# Patient Record
Sex: Male | Born: 1980 | Race: White | Hispanic: No | Marital: Single | Smoking: Former smoker
Health system: Southern US, Community
[De-identification: ages and names within clinical notes are randomized; demographics above are authoritative.]

## PROBLEM LIST (undated history)

## (undated) HISTORY — PX: FRACTURE SURGERY: SHX138

## (undated) HISTORY — PX: TONSILLECTOMY: SUR1361

---
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
## 2008-12-15 ENCOUNTER — Ambulatory Visit: Payer: Self-pay | Admitting: Psychiatry

## 2008-12-15 ENCOUNTER — Inpatient Hospital Stay (HOSPITAL_COMMUNITY): Admission: RE | Admit: 2008-12-15 | Discharge: 2008-12-19 | Payer: Self-pay | Admitting: Psychiatry

## 2010-09-23 LAB — HEPATIC FUNCTION PANEL
ALT: 121 U/L — ABNORMAL HIGH (ref 0–53)
ALT: 146 U/L — ABNORMAL HIGH (ref 0–53)
AST: 103 U/L — ABNORMAL HIGH (ref 0–37)
Alkaline Phosphatase: 62 U/L (ref 39–117)
Alkaline Phosphatase: 68 U/L (ref 39–117)
Bilirubin, Direct: 0.1 mg/dL (ref 0.0–0.3)
Bilirubin, Direct: 0.1 mg/dL (ref 0.0–0.3)
Indirect Bilirubin: 0.4 mg/dL (ref 0.3–0.9)
Total Bilirubin: 0.5 mg/dL (ref 0.3–1.2)
Total Protein: 5.5 g/dL — ABNORMAL LOW (ref 6.0–8.3)

## 2010-09-23 LAB — BENZODIAZEPINE, QUANTITATIVE, URINE
Alprazolam (GC/LC/MS), ur confirm: 160 ng/mL
Flurazepam GC/MS Conf: NEGATIVE
Nordiazepam GC/MS Conf: 690 ng/mL
Oxazepam GC/MS Conf: 3400 ng/mL

## 2010-09-23 LAB — DRUGS OF ABUSE SCREEN W/O ALC, ROUTINE URINE
Amphetamine Screen, Ur: POSITIVE — AB
Barbiturate Quant, Ur: NEGATIVE
Benzodiazepines.: POSITIVE — AB
Cocaine Metabolites: NEGATIVE
Creatinine,U: 181.9 mg/dL
Marijuana Metabolite: POSITIVE — AB
Methadone: NEGATIVE
Opiate Screen, Urine: NEGATIVE
Phencyclidine (PCP): NEGATIVE
Propoxyphene: NEGATIVE

## 2010-09-23 LAB — CBC
HCT: 44.3 % (ref 39.0–52.0)
Hemoglobin: 15.6 g/dL (ref 13.0–17.0)
MCHC: 35.3 g/dL (ref 30.0–36.0)
MCV: 86.8 fL (ref 78.0–100.0)
RBC: 5.11 MIL/uL (ref 4.22–5.81)
WBC: 6.6 10*3/uL (ref 4.0–10.5)

## 2010-09-23 LAB — HEPATITIS PANEL, ACUTE
Hep A IgM: NEGATIVE
Hep B C IgM: NEGATIVE

## 2010-09-23 LAB — URINALYSIS, ROUTINE W REFLEX MICROSCOPIC
Bilirubin Urine: NEGATIVE
Glucose, UA: NEGATIVE mg/dL
Hgb urine dipstick: NEGATIVE
Specific Gravity, Urine: 1.017 (ref 1.005–1.030)
Urobilinogen, UA: 1 mg/dL (ref 0.0–1.0)

## 2010-09-23 LAB — AMPHETAMINES URINE CONFIRMATION
Amphetamines: 3100 ng/mL
Methamphetamine GC/MS, Ur: NEGATIVE
Methylenedioxyamphetamine: NEGATIVE
Methylenedioxyethylamphetamine: NEGATIVE
Methylenedioxymethamphetamine: NEGATIVE

## 2010-09-23 LAB — BASIC METABOLIC PANEL
BUN: 13 mg/dL (ref 6–23)
CO2: 31 mEq/L (ref 19–32)
Chloride: 101 mEq/L (ref 96–112)
Creatinine, Ser: 1.2 mg/dL (ref 0.4–1.5)
Glucose, Bld: 100 mg/dL — ABNORMAL HIGH (ref 70–99)
Potassium: 3.9 mEq/L (ref 3.5–5.1)

## 2010-09-23 LAB — THC (MARIJUANA), URINE, CONFIRMATION: Marijuana, Ur-Confirmation: >500 ng/mL

## 2010-11-02 NOTE — Discharge Summary (Signed)
NAME:  Bobby Cain, Bobby Cain           ACCOUNT NO.:  [id number]   MEDICAL RECORD NO.:  [id number]          PATIENT TYPE:  IPS   LOCATION:  0507                          FACILITY:  [facility]   PHYSICIAN:  Geoffery Lyons, M.D.      DATE OF BIRTH:  July 11, 1980   DATE OF ADMISSION:  12/15/2008  DATE OF DISCHARGE:  12/19/2008                               DISCHARGE SUMMARY   CHIEF COMPLAINT/PRESENT ILLNESS:  This was the first admission to [facility] for this 30 year old male who was admitted  abusing Adderall, smoking marijuana, decreased sleep, using up to 100 mg  plus of Adderall using the prescription in 2 weeks.  Denies suicide  ideations.  Knows he needs help.   PAST PSYCHIATRIC HISTORY:  First time at [facility].  Has seen  Dr. Ander Cain for the last 2 years.  Has been on Paxil in the past.   ALCOHOL/DRUG HISTORY:  As already stated, abusing Adderall, smoking  marijuana.   MEDICAL HISTORY:  Noncontributory.   MEDICATIONS:  1. Prozac 20 mg per day.  2. Adderall 30 mg twice a day, gone in 2 weeks.   PHYSICAL EXAMINATION:  Failed to show any acute findings.   LABORATORY DATA:  TSH 2.522.  BMET within normal limits.  SGOT 180, SGPT  146.  CBC within normal limits.   MENTAL STATUS EXAM:  Reveals alert cooperative male somewhat tired.  Mood depressed.  Affect depressed.  Thought processes logical, coherent  and relevant.  Having a hard time focusing as he described feeling very  overwhelmed with everything that has been going on.  No active suicide  or homicide ideas.  No hallucinations.  No delusions.  Cognition well  preserved.   ADMISSION DIAGNOSES:  AXIS I:  Amphetamine abuse, marijuana abuse and  attention deficit hyperactivity disorder.  AXIS II:  No diagnosis.  AXIS III:  No diagnosis.  AXIS IV:  Moderate.  AXIS V:  Upon admission 35, global assessment of functioning in the last  year 70.   COURSE IN THE HOSPITAL:  He was admitted.  Started individual and  group  psychotherapy.  As already stated, abusing marijuana, decreased sleep,  was using Prozac, using for 14 years, Prozac for the last month.  Endorsed that he was dealing with social anxiety.  He used to drink  quite a bit, not anymore, was on Paxil.  January 13, 2009, he felt he was  so much better, ready to be discharged, but does not know that the  mother worries about him.  There was increasing SGOT and increasing  SGPT.  He was not sure what was going to happen when he got out of the  hospital.  Hepatitis profile was negative.  He endorsed use of multiple  supplements and muscle building.  We were trying to use Librium to help  him through detox, but he was very sedated.  Librium was discontinued.  There was a family session December 18, 2008 with the father and mother.  The  family was cognizant that he does suffer from ADHD and that he needs  medications.  Bobby Cain has  plans for himself.  Wanted to get a job and  return to school.  December 19, 2008, he was in full contact with reality.  There were no active suicide or homicide ideas.  No hallucinations or  delusions.  He was willing and motivated to pursue abstinence and pursue  further outpatient treatment.   DISCHARGE DIAGNOSES:  AXIS I:  Amphetamine abuse, marijuana abuse,  attention deficit hyperactivity disorder, social anxiety.  AXIS II:  No diagnosis.  AXIS III:  Increased liver enzymes.  AXIS IV:  Moderate.  AXIS V:  Upon discharge 50.   FOLLOW UP:  Follow up with Bobby Cain, M.D.   DISCHARGE MEDICATIONS:  Placed on Prozac 20 mg per day.      Geoffery Lyons, M.D.  Electronically Signed     IL/MEDQ  D:  01/18/2009  T:  01/18/2009  Job:  [id number]

## 2016-07-30 ENCOUNTER — Ambulatory Visit (INDEPENDENT_AMBULATORY_CARE_PROVIDER_SITE_OTHER): Payer: BLUE CROSS/BLUE SHIELD | Admitting: Neurology

## 2016-07-30 ENCOUNTER — Encounter: Payer: Self-pay | Admitting: Neurology

## 2016-07-30 VITALS — BP 126/72 | HR 95 | Wt 221.6 lb

## 2016-07-30 DIAGNOSIS — F0781 Postconcussional syndrome: Secondary | ICD-10-CM | POA: Diagnosis not present

## 2016-07-30 DIAGNOSIS — M542 Cervicalgia: Secondary | ICD-10-CM | POA: Diagnosis not present

## 2016-07-30 DIAGNOSIS — H8110 Benign paroxysmal vertigo, unspecified ear: Secondary | ICD-10-CM

## 2016-07-30 DIAGNOSIS — G44309 Post-traumatic headache, unspecified, not intractable: Secondary | ICD-10-CM

## 2016-07-30 DIAGNOSIS — F17210 Nicotine dependence, cigarettes, uncomplicated: Secondary | ICD-10-CM

## 2016-07-30 NOTE — Patient Instructions (Addendum)
I will send you to neuro-rehab to treat vertigo, neck pain and incoordination.    To help reduce HEADACHES: Coenzyme Q10 160mg  ONCE DAILY Riboflavin/Vitamin B2 400mg  ONCE DAILY Magnesium oxide 400mg  ONCE - TWICE DAILY May stop after headaches are resolved.                                                                                               To help with INSOMNIA: Melatonin 3-5mg  AT BEDTIME       Other medicines to help decrease inflammation Alpha Lipoic Acid 100mg  TWICE DAILY Turmeric 500mg  twice daily Iron 65mg  elemental daily Vitamin D 4000 IU daily for 2 weeks then 2000 IU daily thereafter.  Follow up in 2 months.
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
## 2016-08-02 ENCOUNTER — Encounter: Payer: Self-pay | Admitting: Physical Therapy

## 2016-08-02 ENCOUNTER — Ambulatory Visit: Payer: BLUE CROSS/BLUE SHIELD | Attending: Neurology | Admitting: Physical Therapy

## 2016-08-02 ENCOUNTER — Telehealth: Payer: Self-pay | Admitting: Physical Therapy

## 2016-08-02 DIAGNOSIS — R296 Repeated falls: Secondary | ICD-10-CM

## 2016-08-02 DIAGNOSIS — R4189 Other symptoms and signs involving cognitive functions and awareness: Secondary | ICD-10-CM

## 2016-08-02 DIAGNOSIS — R2681 Unsteadiness on feet: Secondary | ICD-10-CM | POA: Diagnosis present

## 2016-08-02 DIAGNOSIS — R42 Dizziness and giddiness: Secondary | ICD-10-CM | POA: Insufficient documentation

## 2016-08-02 NOTE — Telephone Encounter (Signed)
Tad Mooreasandra, Could we refer Mr. Estala to speech therapy for cognitive rehabilitation?  Thanks.

## 2016-08-02 NOTE — Telephone Encounter (Signed)
Done
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
## 2016-08-02 NOTE — Telephone Encounter (Signed)
Dr. Everlena CooperJaffe, Pt seen for PT evaluation today.  Pt noted to have significant cognitive impairments as part of post-concussive syndrome.  Pt would benefit from and is interested in Speech Therapy for cognitive rehabilitation.  Please write an order for Speech therapy eval/treat if you agree.  Thanks, Edman CircleAudra Hall, PT, DPT 08/02/16    2:35 PM

## 2016-08-08 ENCOUNTER — Ambulatory Visit: Payer: BLUE CROSS/BLUE SHIELD

## 2016-08-08 DIAGNOSIS — R42 Dizziness and giddiness: Secondary | ICD-10-CM

## 2016-08-08 DIAGNOSIS — R2681 Unsteadiness on feet: Secondary | ICD-10-CM

## 2016-08-08 NOTE — Patient Instructions (Signed)
Gaze Stabilization: Tip Card  1.Target must remain in focus, not blurry, and appear stationary while head is in motion. 2.Perform exercises with small head movements (45 to either side of midline). 3.Increase speed of head motion so long as target is in focus. 4.If you wear eyeglasses, be sure you can see target through lens (therapist will give specific instructions for bifocal / progressive lenses). 5.These exercises may provoke dizziness or nausea. Work through these symptoms. If too dizzy, slow head movement slightly. Rest between each exercise. 6.Exercises demand concentration; avoid distractions. 7.For safety, perform standing exercises close to a counter, wall, corner, or next to someone.  Copyright  VHI. All rights reserved.    Gaze Stabilization: Standing Feet Apart    Feet shoulder width apart, keeping eyes on target on wall _3-5___ feet away, tilt head down 15-30 and move head side to side for __20-30__ seconds.  Do __2-3__ sessions per day.  Copyright  VHI. All rights reserved.

## 2016-08-08 NOTE — Therapy (Addendum)
[facility] [address] [place], [place], [address] Phone: [phone]   Fax:  [phone]  Physical Therapy Treatment  Patient Details  Name: Bobby Cain MRN: [id number] Date of Birth: February 05, 1981 Referring Provider: Drema Dallas, DO  Encounter Date: 08/08/2016      PT End of Session - 08/08/16 1218    Visit Number 2   Number of Visits 5   Date for PT Re-Evaluation 09/01/16   Authorization Type Blue Cross Blue [employer]   PT Start Time 1101   PT Stop Time 1146   PT Time Calculation (min) 45 min   Equipment Utilized During Treatment --  min guard prn and SOT harness   Activity Tolerance Patient tolerated treatment well   Behavior During Therapy [facility] for tasks assessed/performed      History reviewed. No pertinent past medical history.  History reviewed. No pertinent surgical history.  There were no vitals filed for this visit.      Subjective Assessment - 08/08/16 1108    Subjective Pt reports he has been experiencing allergies for the last few days, rash on arms/back and clear nasal mucus. Pt was house sitting dogs in a home with mildew. Pt reports dizziness is about the same (worse when looking up). Pt feels "sluggish" and fatigued. Pt feels frustrated that he's not back to normal yet.  Pt reports neck pain is getting better.    Pertinent History none   Currently in Pain? No/denies            [facility] PT Assessment - 08/08/16 1112      Functional Gait  Assessment   Gait assessed  Yes   Gait Level Surface Walks 20 ft in less than 7 sec but greater than 5.5 sec, uses assistive device, slower speed, mild gait deviations, or deviates 6-10 in outside of the 12 in walkway width.  2.5 seconds   Change in Gait Speed Able to smoothly change walking speed without loss of balance or gait deviation. Deviate no more than 6 in outside of the 12 in walkway width.   Gait with Horizontal Head Turns Performs head  turns smoothly with slight change in gait velocity (eg, minor disruption to smooth gait path), deviates 6-10 in outside 12 in walkway width, or uses an assistive device.   Gait with Vertical Head Turns Performs task with slight change in gait velocity (eg, minor disruption to smooth gait path), deviates 6 - 10 in outside 12 in walkway width or uses assistive device   Gait and Pivot Turn Turns slowly, requires verbal cueing, or requires several small steps to catch balance following turn and stop   Step Over Obstacle Is able to step over 2 stacked shoe boxes taped together (9 in total height) without changing gait speed. No evidence of imbalance.   Gait with Narrow Base of Support Is able to ambulate for 10 steps heel to toe with no staggering.   Gait with Eyes Closed Walks 20 ft, no assistive devices, good speed, no evidence of imbalance, normal gait pattern, deviates no more than 6 in outside 12 in walkway width. Ambulates 20 ft in less than 7 sec.   Ambulating Backwards Walks 20 ft, uses assistive device, slower speed, mild gait deviations, deviates 6-10 in outside 12 in walkway width.   Steps Alternating feet, no rail.   Total Score 24       Neuro re-ed: Neuro re-ed: sensory organization test performed with following results: Conditions: 1:  WNL 2: 1 trial WNL and 2 trials below normal 3: 1 trial WNL and 2 trials below normal  4: 1 trial WNL and 2 trials below normal  5: All 3 trials "falls" 6: 1 trial WNL and 2 trials below normal Composite score: 53, below normal for his age group Sensory Analysis Som: Below normal (~90) Vis: Below normal (~70) Vest: Below normal (~1) Pref: WNL, but this is likely incorrect as the vestibular value of 1 skews the ratio. Strategy analysis: WNL, except for decreased hip strategy during conditions 5 and 6.       COG alignment: WNL, with occasional posterior bias.                      Vestibular Treatment/Exercise - 08/08/16 0001       Vestibular Treatment/Exercise   Vestibular Treatment Provided Gaze   Habituation Exercises --   Gaze Exercises X1 Viewing Horizontal     X1 Viewing Horizontal   Foot Position Standing and seated. Cues and demo for technique. Please see pt instructions for details.    Time --  20-30 seconds.   Reps 4   Comments See above.               PT Education - 08/08/16 1218    Education provided Yes   Education Details PT discussed FGA and SOT results. PT also provided pt with gaze stabilization HEP.   Person(s) Educated Patient   Methods Explanation;Demonstration;Verbal cues;Handout   Comprehension Returned demonstration;Verbalized understanding             PT Long Term Goals - 08/08/16 1244      PT LONG TERM GOAL #1   Title  (TARGET DATE FOR ALL LTG 09/01/16) Pt will demonstrate independence with vestibular/balance HEP   Time 4   Period Weeks   Status New     PT LONG TERM GOAL #2   Title Pt will improve DVA to 2 line difference with improved gaze stability   Baseline 4 line difference   Time 4   Period Weeks   Status New     PT LONG TERM GOAL #3   Title Pt will perform FGA assessment with goal to be set at next visit   Status Achieved     PT LONG TERM GOAL #4   Title Pt will participate in SOT testing with LTG to be set   Status Achieved     PT LONG TERM GOAL #5   Title 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   Baseline Unable to work at this time   Time 4   Period Weeks   Status New     Additional Long Term Goals   Additional Long Term Goals Yes     PT LONG TERM GOAL #6   Title Pt will improve FGA score to >/=30/30 to decr. falls risk.    Status New     PT LONG TERM GOAL #7   Title Pt will improve SOT composite score to WNL for his age group (6430-36 years of age) to >/=70 in order to safety during functional mobility.    Status New               Plan - 08/08/16 1241     Clinical Impression Statement Pt's FGA score indicates pt is at a moderate risk for falls. Pt's composite SOT score is below normal for his age group (1430-139 years of age).  The SOT sensory analysis indicates decr. somatosensory, visual, and vestibular systems is decreased-especially the visual and vestibular systems. Pt reported incr. in dizziness (1-2/10) during SOT and during amb. with head turns. Continue with POC.    Rehab Potential Excellent   PT Frequency 1x / week   PT Duration 4 weeks   PT Treatment/Interventions ADLs/Self Care Home Management;Canalith Repostioning;Gait training;Functional mobility training;Therapeutic activities;Therapeutic exercise;Balance training;Neuromuscular re-education;Cognitive remediation;Patient/family education;Energy conservation;Vestibular;Visual/perceptual remediation/compensation   PT Next Visit Plan Review and modifiy gaze stabilization HEP as indicated. Dynamic gait and balance activities over compliant surfaces to improve vestibular input.    Consulted and Agree with Plan of Care Patient      Patient will benefit from skilled therapeutic intervention in order to improve the following deficits and impairments:  Decreased balance, Dizziness, Impaired sensation, Impaired vision/preception, Pain  Visit Diagnosis: Dizziness and giddiness  Unsteadiness on feet     Problem List There are no active problems to display for this patient.   Dennise Raabe L 08/08/2016, 1:08 PM  [facility] [address] [place], [place], [address] Phone: [phone]   Fax:  [phone]  Name: LENDELL GALLICK MRN: [id number] Date of Birth: 1981-05-16  Zerita Boers, PT,DPT 08/08/16 1:08 PM Phone: [phone] Fax: [phone]

## 2016-08-15 ENCOUNTER — Ambulatory Visit: Payer: BLUE CROSS/BLUE SHIELD | Attending: Neurology | Admitting: Physical Therapy

## 2016-08-15 ENCOUNTER — Encounter: Payer: Self-pay | Admitting: Physical Therapy

## 2016-08-15 ENCOUNTER — Ambulatory Visit: Payer: BLUE CROSS/BLUE SHIELD | Admitting: Speech Pathology

## 2016-08-15 DIAGNOSIS — R41841 Cognitive communication deficit: Secondary | ICD-10-CM | POA: Insufficient documentation

## 2016-08-15 DIAGNOSIS — R2681 Unsteadiness on feet: Secondary | ICD-10-CM | POA: Diagnosis present

## 2016-08-15 DIAGNOSIS — R42 Dizziness and giddiness: Secondary | ICD-10-CM

## 2016-08-15 DIAGNOSIS — R296 Repeated falls: Secondary | ICD-10-CM | POA: Insufficient documentation

## 2016-08-15 NOTE — Patient Instructions (Signed)
  Cognitive Activities you can do at home:   - Solitaire  - Majong  - Scrabble  - Chess/Checkers  - Crosswords (easy level)  - Juanna CaoUno  - Card Games  - Board Games  - Connect 4  - Simon  - the Memory Game  - Dominoes  - Backgammon  On your computer, tablet or phone: BrainHQ [email or web address] Secretary/administratoreuronation App Memory Match Game App Rush Hour Chocolate Fix Sort it out
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
## 2016-08-15 NOTE — Patient Instructions (Signed)
-   Eyes Open    With eyes open:  feet together, move head slowly: up and down x10 to each side. Then up and down x 10 each way.  Repeat 2  times per session. Do 2 sessions per day.  Copyright  VHI. All rights reserved.      Feet Partial Heel-Toe (Compliant Surface) Head Motion - Eyes Open    With eyes open, standing on compliant surface: right foot partially in front of the other (or completely in front of left foot), move head slowly: right and left x 10, up and down x 10 Repeat 2 times per session. Do 2 sessions per day.  Copyright  VHI. All rights reserved.      Gaze Stabilization: Tip Card  1.Target must remain in focus, not blurry, and appear stationary while head is in motion. 2.Perform exercises with small head movements (45 to either side of midline). 3.Increase speed of head motion so long as target is in focus. 4.If you wear eyeglasses, be sure you can see target through lens (therapist will give specific instructions for bifocal / progressive lenses). 5.These exercises may provoke dizziness or nausea. Work through these symptoms. If too dizzy, slow head movement slightly. Rest between each exercise. 6.Exercises demand concentration; avoid distractions.  Copyright  VHI. All rights reserved.     Special Instructions: Exercises may bring on mild to moderate symptoms of blurry vision, double vision, nausea that resolve within 30 minutes of completing exercises. If symptoms are lasting longer than 30 minutes, modify your exercises by:  >decreasing the # of times you complete each activity >ensuring your symptoms return to baseline before moving onto the next exercise >dividing up exercises so you do not do them all in one session, but multiple short sessions throughout the day >doing them once a day until symptoms improve   Gaze Stabilization: Standing Feet Together    Feet together, keeping eyes on target on wall _2-3___ feet away, tilt head down 15-30 and  move head side to side for _30___ seconds. Repeat while moving head up and down for __30__ seconds. Repeat 3 times each direction. Do __3__ sessions per day. Repeat using target on pattern background.  Copyright  VHI. All rights reserved.  Gaze Stabilization: Standing Feet Heel-Toe "Tandem"    Feet in full heel-toe position, keeping eyes on target on wall __2-3__ feet away, tilt head down 15-30 and move head side to side for _30___ seconds. Repeat while moving head up and down for _30___ seconds.  Repeat 3 times each direction Do __3__ sessions per day. Repeat using target on pattern background.  Copyright  VHI. All rights reserved.
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
## 2016-08-22 ENCOUNTER — Ambulatory Visit: Payer: BLUE CROSS/BLUE SHIELD | Admitting: Physical Therapy

## 2016-08-22 ENCOUNTER — Encounter: Payer: Self-pay | Admitting: Physical Therapy

## 2016-08-22 DIAGNOSIS — R296 Repeated falls: Secondary | ICD-10-CM

## 2016-08-22 DIAGNOSIS — R42 Dizziness and giddiness: Secondary | ICD-10-CM | POA: Diagnosis not present

## 2016-08-22 DIAGNOSIS — R2681 Unsteadiness on feet: Secondary | ICD-10-CM

## 2016-08-22 NOTE — Therapy (Signed)
[facility] [address] [place], [place], [address] Phone: [phone]   Fax:  [phone]  Physical Therapy Treatment  Patient Details  Name: Bobby Cain MRN: [id number] Date of Birth: 1981/05/07 Referring Provider: Drema Dallas, DO  Encounter Date: 08/22/2016      PT End of Session - 08/22/16 1158    Visit Number 4   Number of Visits 5   Date for PT Re-Evaluation 09/01/16   Authorization Type Blue [employer] Blue [employer]   PT Start Time 1105   PT Stop Time 1153   PT Time Calculation (min) 48 min   Activity Tolerance Patient tolerated treatment well   Behavior During Therapy [facility] for tasks assessed/performed      History reviewed. No pertinent past medical history.  History reviewed. No pertinent surgical history.  There were no vitals filed for this visit.      Subjective Assessment - 08/22/16 1109    Subjective Pt reports exercises are going well at home; still on light duty at work.  No issues except some pressure in his head afterwards.  Still doing some jogging.  Reports feeling fatigued.   Limitations Other (comment)   Patient Stated Goals "get organized" pt mainly concerned about cognitive issues and personality changes   Currently in Pain? No/denies           [facility] Adult PT Treatment/Exercise - 08/22/16 1126      Neuro Re-ed    Neuro Re-ed Details  Performed gait forwards and retro >1,000 while performing reaching to ground for rolling ball > toss and then performing ball bounce<>toss up during gait forwards and retro with added cognitive tasks; pt with increased difficulty with naming tasks but able to perform math task without difficulty.     Exercises   Exercises Knee/Hip     Knee/Hip Exercises: Standing   Forward Step Up Right;Left;10 reps;Step Height: 8"   Forward Step Up Limitations no UE support, then holding 8lb weight in RUE and then LUE   Step Down Right;Left;10 reps;Step Height:  8"  step down retro, then holding weight in RUE and then LUE   Functional Squat 20 reps   Functional Squat Limitations Reaching down to floor to retrieve weighted items and then retrieve weighted items and transfer to storage reaching side to side           Balance Exercises - 08/22/16 1156      Balance Exercises: Standing   Balance Beam Standing on balance beam bilat LE >> SLS while reaching out of BOS to R and L for pegs and placing pegs on board in various patterns for more fine motor training           PT Education - 08/22/16 1157    Education provided Yes   Education Details Pt progress; plan to D/C at next session   Person(s) Educated Patient   Methods Explanation   Comprehension Verbalized understanding             PT Long Term Goals - 08/08/16 1244      PT LONG TERM GOAL #1   Title  (TARGET DATE FOR ALL LTG 09/01/16) Pt will demonstrate independence with vestibular/balance HEP   Time 4   Period Weeks   Status New     PT LONG TERM GOAL #2   Title Pt will improve DVA to 2 line difference with improved gaze stability   Baseline 4 line difference   Time 4  Period Weeks   Status New     PT LONG TERM GOAL #3   Title Pt will perform FGA assessment with goal to be set at next visit   Status Achieved     PT LONG TERM GOAL #4   Title Pt will participate in SOT testing with LTG to be set   Status Achieved     PT LONG TERM GOAL #5   Title 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   Baseline Unable to work at this time   Time 4   Period Weeks   Status New     Additional Long Term Goals   Additional Long Term Goals Yes     PT LONG TERM GOAL #6   Title Pt will improve FGA score to >/=30/30 to decr. falls risk.    Status New     PT LONG TERM GOAL #7   Title Pt will improve SOT composite score to WNL for his age group (1930-36 years of age) to >/=70 in order to safety during functional  mobility.    Status New               Plan - 08/22/16 1158    Clinical Impression Statement Pt making great progress towards goals with pt able to tolerate higher level gait, balance, gross motor and fine motor and dual task activities with no c/o dizziness.  Pt continues to have greatest difficulty with cognitive activities and feels he would like to focus more on Speech therapy and cognition.   Rehab Potential Excellent   PT Frequency 1x / week   PT Duration 4 weeks   PT Treatment/Interventions ADLs/Self Care Home Management;Canalith Repostioning;Gait training;Functional mobility training;Therapeutic activities;Therapeutic exercise;Balance training;Neuromuscular re-education;Cognitive remediation;Patient/family education;Energy conservation;Vestibular;Visual/perceptual remediation/compensation   PT Next Visit Plan Next session review LTG and plan to D/C and allow pt to focus on SLP/cognition   Consulted and Agree with Plan of Care Patient      Patient will benefit from skilled therapeutic intervention in order to improve the following deficits and impairments:  Decreased balance, Dizziness, Impaired sensation, Impaired vision/preception, Pain  Visit Diagnosis: Dizziness and giddiness  Unsteadiness on feet  Repeated falls     Problem List There are no active problems to display for this patient.  Edman CircleAudra Hall, PT, DPT 08/22/16    12:04 PM    [facility] [address] [place], [place], [address] Phone: [phone]   Fax:  [phone]  Name: Bobby Cain MRN: [id number] Date of Birth: 1981-05-26

## 2016-08-29 ENCOUNTER — Encounter: Payer: Self-pay | Admitting: Physical Therapy

## 2016-08-29 ENCOUNTER — Ambulatory Visit: Payer: BLUE CROSS/BLUE SHIELD | Admitting: Physical Therapy

## 2016-08-29 DIAGNOSIS — R2681 Unsteadiness on feet: Secondary | ICD-10-CM

## 2016-08-29 DIAGNOSIS — R296 Repeated falls: Secondary | ICD-10-CM

## 2016-08-29 DIAGNOSIS — R42 Dizziness and giddiness: Secondary | ICD-10-CM | POA: Diagnosis not present

## 2016-08-29 NOTE — Patient Instructions (Signed)
Gaze Stabilization: Tip Card  1.Target must remain in focus, not blurry, and appear stationary while head is in motion. 2.Perform exercises with small head movements (45 to either side of midline). 3.Increase speed of head motion so long as target is in focus. 4.If you wear eyeglasses, be sure you can see target through lens (therapist will give specific instructions for bifocal / progressive lenses). 5.These exercises may provoke dizziness or nausea. Work through these symptoms. If too dizzy, slow head movement slightly. Rest between each exercise. 6.Exercises demand concentration; avoid distractions. 7.For safety, perform standing exercises close to a counter, wall, corner, or next to someone.  Copyright  VHI. All rights reserved.    Gaze Stabilization: Standing Feet Apart    Feet shoulder width apart, keeping eyes on target on wall _3-6___ feet away, tilt head down 15-30 and move head side to side for __30-60__ seconds. Repeat going up and down x 30-60 seconds. Do __2-3__ sessions per day.

## 2016-08-29 NOTE — Therapy (Signed)
[facility] [address] [place], [place], [address] Phone: [phone]   Fax:  [phone]  Physical Therapy Treatment  Patient Details  Name: Bobby Cain MRN: [id number] Date of Birth: 06/13/1981 Referring Provider: Pieter Partridge, DO  Encounter Date: 08/29/2016      PT End of Session - 08/29/16 1657    Visit Number 5   Number of Visits 5   Date for PT Re-Evaluation 09/01/16  D/C today   Authorization Type Blue Cross Blue [employer]   PT Start Time 1400   PT Stop Time 1450   PT Time Calculation (min) 50 min   Activity Tolerance Patient tolerated treatment well   Behavior During Therapy [facility] for tasks assessed/performed      History reviewed. No pertinent past medical history.  History reviewed. No pertinent surgical history.  There were no vitals filed for this visit.      Subjective Assessment - 08/29/16 1404    Subjective Pt reports work is still going well; still doing well physically.  No dizziness.   Pertinent History none   Limitations Other (comment)   Patient Stated Goals "get organized" pt mainly concerned about cognitive issues and personality changes   Currently in Pain? No/denies            [facility] PT Assessment - 08/29/16 1409      Functional Gait  Assessment   Gait assessed  Yes   Gait Level Surface Walks 20 ft in less than 5.5 sec, no assistive devices, good speed, no evidence for imbalance, normal gait pattern, deviates no more than 6 in outside of the 12 in walkway width.   Change in Gait Speed Able to smoothly change walking speed without loss of balance or gait deviation. Deviate no more than 6 in outside of the 12 in walkway width.   Gait with Horizontal Head Turns Performs head turns smoothly with no change in gait. Deviates no more than 6 in outside 12 in walkway width   Gait with Vertical Head Turns Performs head turns with no change in gait. Deviates no more than 6 in outside  12 in walkway width.   Gait and Pivot Turn Pivot turns safely within 3 sec and stops quickly with no loss of balance.   Step Over Obstacle Is able to step over 2 stacked shoe boxes taped together (9 in total height) without changing gait speed. No evidence of imbalance.   Gait with Narrow Base of Support Is able to ambulate for 10 steps heel to toe with no staggering.   Gait with Eyes Closed Walks 20 ft, no assistive devices, good speed, no evidence of imbalance, normal gait pattern, deviates no more than 6 in outside 12 in walkway width. Ambulates 20 ft in less than 7 sec.   Ambulating Backwards Walks 20 ft, no assistive devices, good speed, no evidence for imbalance, normal gait   Steps Alternating feet, no rail.   Total Score 30          Vestibular Assessment - 08/29/16 1405      Visual Acuity   Static 8   Dynamic 5          [facility] Adult PT Treatment/Exercise - 08/29/16 1434      Neuro Re-ed    Neuro Re-ed Details  SOT testing: 62 composite score      Sensory Organization Testing=62% compared to age/height normative value of 74%.   Patient demonstrated 85% use of somatosensory feedback for  balance, 74% use of visual feedback for balance, and 20% use of vestibular feedback for balance.        PT Education - 08/29/16 1656    Education provided Yes   Education Details Continue to focus on x 1 viewing and gaze stabilization exercises at home   Person(s) Educated Patient   Methods Explanation;Demonstration;Handout   Comprehension Verbalized understanding;Returned demonstration            PT Long Term Goals - 08/29/16 1435      PT LONG TERM GOAL #1   Title  (TARGET DATE FOR ALL LTG 09/01/16) Pt will demonstrate independence with vestibular/balance HEP   Status Achieved     PT LONG TERM GOAL #2   Title Pt will improve DVA to 2 line difference with improved gaze stability   Baseline 3 line difference on 3/15   Status Partially Met     PT LONG TERM GOAL #3   Title  Pt will perform FGA assessment with goal to be set at next visit   Status Achieved     PT LONG TERM GOAL #4   Title Pt will participate in SOT testing with LTG to be set   Status Achieved     PT LONG TERM GOAL #5   Title 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   Status Achieved     PT LONG TERM GOAL #6   Title Pt will improve FGA score to >/=30/30 to decr. falls risk.    Baseline 30/30 on 08/29/16   Status Achieved     PT LONG TERM GOAL #7   Title Pt will improve SOT composite score to WNL for his age group (74-36 years of age) to >/=70 in order to safety during functional mobility.    Baseline 62 composite score on 08/29/16   Status Partially Met               Plan - 08/29/16 1657    Clinical Impression Statement Pt has made good progress and has met 5/7 LTG; pt partially met DVA goal and was able to improve gaze stability by 1 line; now 3 line difference.  Reviewed x 1 viewing exercises and progressed exercises with pt and re-printed instructions.  Advised pt to continue exercises at home.  Pt did not meet SOT composite score goal but did improve to 62 with improved use of all systems but visual and vestibular still below average for age.  Pt is safe for D/C from PT and will continue with Speech therapy for cognitive rehabilitation.     PT Treatment/Interventions ADLs/Self Care Home Management;Canalith Repostioning;Gait training;Functional mobility training;Therapeutic activities;Therapeutic exercise;Balance training;Neuromuscular re-education;Cognitive remediation;Patient/family education;Energy conservation;Vestibular;Visual/perceptual remediation/compensation   PT Next Visit Plan D/C today   Consulted and Agree with Plan of Care Patient      Patient will benefit from skilled therapeutic intervention in order to improve the following deficits and impairments:  Decreased balance, Dizziness, Impaired  sensation, Impaired vision/preception, Pain  Visit Diagnosis: Dizziness and giddiness  Unsteadiness on feet  Repeated falls     Problem List There are no active problems to display for this patient.  PHYSICAL THERAPY DISCHARGE SUMMARY  Visits from Start of Care: 5  Current functional level related to goals / functional outcomes: See above   Remaining deficits: Gaze stability and use of visual and vestibular systems for balance recovery   Education / Equipment: HEP  Plan: Patient agrees to discharge.  Patient goals were partially  met. Patient is being discharged due to being pleased with the current functional level.  ?????     Raylene Everts, PT, DPT 08/29/16    5:05 PM    [facility] [address] [place], [place], [address] Phone: [phone]   Fax:  [phone]  Name: Bobby Cain MRN: [id number] Date of Birth: [birth year]

## 2016-09-03 ENCOUNTER — Ambulatory Visit: Payer: BLUE CROSS/BLUE SHIELD | Admitting: *Deleted

## 2016-09-03 DIAGNOSIS — R42 Dizziness and giddiness: Secondary | ICD-10-CM | POA: Diagnosis not present

## 2016-09-03 DIAGNOSIS — R41841 Cognitive communication deficit: Secondary | ICD-10-CM

## 2016-09-03 NOTE — Therapy (Signed)
[facility] [address] [place], [place], [address] Phone: [phone]   Fax:  [phone]  Speech Language Pathology Treatment  Patient Details  Name: Bobby Cain MRN: [id number] Date of Birth: [birth year] Referring Provider: Dr. Shon MilletAdam Jaffe  Encounter Date: 09/03/2016      End of Session - 09/03/16 1309    Visit Number 2   Number of Visits 17   Date for SLP Re-Evaluation 10/10/16   SLP Start Time 1100   SLP Stop Time  1145   SLP Time Calculation (min) 45 min   Activity Tolerance Patient tolerated treatment well      No past medical history on file.  No past surgical history on file.  There were no vitals filed for this visit.      Subjective Assessment - 09/03/16 1305    Subjective I got a speeding ticket - my decision making isn't so good   Currently in Pain? No/denies               ADULT SLP TREATMENT - 09/03/16 0001      General Information   Behavior/Cognition Alert;Cooperative;Pleasant mood     Treatment Provided   Treatment provided Cognitive-Linquistic     Pain Assessment   Pain Assessment No/denies pain     Cognitive-Linquistic Treatment   Treatment focused on Cognition;Patient/family/caregiver education   Skilled Treatment Skilled session targeted review of goals set following initial evaluation (last session). Pt reports having difficulty with attention and keeping up with things. SLP and pt identified 4 strategies for improving attention: do one thing at a time, take care of self (eat, drink, exercise, rest), minimize distractions, write it down) Pt and SLP also identified suggestions for improving functional recall - get a calendar, use notes, write down lists, to do's, identify a consistent place to keep things frequently lost - keys, wallet, meds.      Assessment / Recommendations / Plan   Plan Continue with current plan of care     Progression Toward Goals   Progression toward goals Progressing toward goals          SLP Education - 09/03/16 1309    Education provided Yes   Education Details improving attention and functional recall   Person(s) Educated Patient   Methods Explanation;Demonstration;Handout   Comprehension Verbalized understanding;Returned demonstration          SLP Short Term Goals - 09/03/16 1313      SLP SHORT TERM GOAL #1   Title Pt will utilize external/environmental aids to keep tract of keys, wallet, cards, remote with no more othan 3 items llost over 2 weeks   Time 4   Period Weeks   Status On-going     SLP SHORT TERM GOAL #2   Title Pt will verbalize 4 compensations for attention impairments with rare min A   Time 4   Period Weeks   Status On-going     SLP SHORT TERM GOAL #3   Title Pt will attend to details in moderately complex organization, reasoning cognitive linguistic tasks with 80% accuracy and occasional min A   Time 4   Period Weeks   Status On-going     SLP SHORT TERM GOAL #4   Title Pt will altenrate attention between 2 moderately complex cognitive linguistic tasks with 85% on each and occasional min A   Time 4   Period Weeks   Status On-going          SLP Long  Term Goals - 09/03/16 1313      SLP LONG TERM GOAL #1   Title Pt will perform cognitive linguistic organization tasks with 95% accuracy and rare min A   Time 8   Period Weeks   Status On-going     SLP LONG TERM GOAL #2   Title Pt will carryover strategies/external aids for organizing tasks, schedule, chores over 3 sessions with rare min A   Time 8   Period Weeks   Status On-going     SLP LONG TERM GOAL #3   Title Pt will divide attention between 2 simple cognitive linguistic tasks with 90% on each and rare min A   Time 8   Period Weeks   Status On-going          Plan - 09/03/16 1310    Clinical Impression Statement Pt presents with decreased attention and functional recall. Strategies were discussed and  provided in written form. Pt has few responsibilities - parents manage finances (insurance, cell phone, etc - even prior to [facility]), girlfriend pays rent (he will return to living with parents when she moves to [place]). Pt indicates difficulty with reflux at night. Provided information regarding management of esophageal dysmotility. Continued ST intervention is recommended to maximize safety and independence and prepare for return to work.    Speech Therapy Frequency 2x / week   Duration --  8 weeks/17 visits   Treatment/Interventions Compensatory strategies;Functional tasks;Patient/family education;Cognitive reorganization;Environmental controls;Internal/external aids;SLP instruction and feedback   Potential to Achieve Goals Good   Potential Considerations Ability to learn/carryover information   Consulted and Agree with Plan of Care Patient      Patient will benefit from skilled therapeutic intervention in order to improve the following deficits and impairments:   Cognitive communication deficit    Problem List There are no active problems to display for this patient.  Bryla Burek B. [facility], MSP, CCC-SLP  Leigh Aurora 09/03/2016, 1:14 PM  [facility] [address] [place], [place], [address] Phone: [phone]   Fax:  [phone]   Name: Bobby Cain MRN: [id number] Date of Birth: 05-21-1981

## 2016-09-03 NOTE — Patient Instructions (Signed)
Improving attention 1. Do 1 thing at a time 2. Take care of yourself - eat regularly, work out, stay hydrated 3. Minimize distractions - turn off radio, don't talk on the phone 4. Write it down    Suggestions for improving recall: 1. Get a small calendar 2. Use sticky notes or a pad in the back of the calendar 3. Write down things to do, schedules, lists - cross out when done 4. Find a consistent place to keep things you tend to misplace - keys, wallet, meds

## 2016-09-10 ENCOUNTER — Ambulatory Visit: Payer: BLUE CROSS/BLUE SHIELD | Admitting: Speech Pathology

## 2016-09-10 DIAGNOSIS — R41841 Cognitive communication deficit: Secondary | ICD-10-CM

## 2016-09-10 DIAGNOSIS — R42 Dizziness and giddiness: Secondary | ICD-10-CM | POA: Diagnosis not present

## 2016-09-10 NOTE — Therapy (Signed)
[facility] [address] [place], [place], [address] Phone: [phone]   Fax:  [phone]  Speech Language Pathology Treatment  Patient Details  Name: CONRADO NANCE MRN: [id number] Date of Birth: 1980-10-15 Referring Provider: Dr. Shon Millet  Encounter Date: 09/10/2016      End of Session - 09/10/16 1208    Visit Number 3   Number of Visits 17   Date for SLP Re-Evaluation 10/10/16   SLP Start Time 0934   SLP Stop Time  1015   SLP Time Calculation (min) 41 min   Activity Tolerance Patient tolerated treatment well      No past medical history on file.  No past surgical history on file.  There were no vitals filed for this visit.      Subjective Assessment - 09/10/16 0939    Subjective "I got a place for my keys, so it's no longer an issue"               ADULT SLP TREATMENT - 09/10/16 0940      General Information   Behavior/Cognition Alert;Cooperative;Pleasant mood     Treatment Provided   Treatment provided Cognitive-Linquistic     Pain Assessment   Pain Assessment No/denies pain     Cognitive-Linquistic Treatment   Treatment focused on Cognition;Patient/family/caregiver education   Skilled Treatment Pt reports getting a hook for his keys so he does not misplace them. He also reports successful carryover of using calendar for schedule management. Alternating attention and attention to detail  organizing mall stores according to clues - pt required min cues to attend to the name of the shop in the mall key, not just the type of shop. Also required cues to attend to directions when there is more than 1 step or detail  in the direction/clue. Occasional min A for basic reasoning and to ID if he has used a store twice. Mildly complex functional math reasoning with extended time and cues to reduce impulisivity - occasional to usual min for reasoning/problem solving.  Pt reports increase in  tearfulness/lability. 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     Assessment / Recommendations / Plan   Plan Continue with current plan of care     Progression Toward Goals   Progression toward goals Progressing toward goals          SLP Education - 09/10/16 1015    Education provided Yes   Education Details compensations for attention deficts   Person(s) Educated Patient   Methods Explanation;Demonstration;Handout   Comprehension Verbalized understanding;Need further instruction;Verbal cues required          SLP Short Term Goals - 09/10/16 1207      SLP SHORT TERM GOAL #1   Title Pt will utilize external/environmental aids to keep tract of keys, wallet, cards, remote with no more othan 3 items llost over 2 weeks   Baseline 09/10/16;    Time 3   Period Weeks   Status On-going     SLP SHORT TERM GOAL #2   Title Pt will verbalize 4 compensations for attention impairments with rare min A   Time 3   Period Weeks   Status On-going     SLP SHORT TERM GOAL #3   Title Pt will attend to details in moderately complex organization, reasoning cognitive linguistic tasks with 80% accuracy and occasional min A   Time 3   Period Weeks   Status On-going     SLP SHORT TERM  GOAL #4   Title Pt will altenrate attention between 2 moderately complex cognitive linguistic tasks with 85% on each and occasional min A   Time 3   Period Weeks   Status On-going          SLP Long Term Goals - 09/10/16 1208      SLP LONG TERM GOAL #1   Title Pt will perform cognitive linguistic organization tasks with 95% accuracy and rare min A   Time 7   Period Weeks   Status On-going     SLP LONG TERM GOAL #2   Title Pt will carryover strategies/external aids for organizing tasks, schedule, chores over 3 sessions with rare min A   Time 7   Period Weeks   Status On-going     SLP LONG TERM GOAL #3   Title Pt will divide attention between 2 simple cognitive linguistic tasks with 90% on each and rare min A    Time 7   Period Weeks   Status On-going          Plan - 09/10/16 1205    Clinical Impression Statement Pt reports utilizing external memory aids and calendar at home with success. He required usual min to mod A for attention to details, alternating attention and reasoning/functional math today.  Continue skilled ST to maximize cognition for safey, higher level ADL's and possible return to his prior position at work.    Speech Therapy Frequency 2x / week   Treatment/Interventions Compensatory strategies;Functional tasks;Patient/family education;Cognitive reorganization;Environmental controls;Internal/external aids;SLP instruction and feedback   Potential to Achieve Goals Good   Potential Considerations Ability to learn/carryover information   Consulted and Agree with Plan of Care Patient      Patient will benefit from skilled therapeutic intervention in order to improve the following deficits and impairments:   Cognitive communication deficit    Problem List There are no active problems to display for this patient.   Kamren Heskett, Radene JourneyLaura Ann MS, CCC-SLP 09/10/2016, 12:09 PM  [facility] [address] [place], [place], [address] Phone: [phone]   Fax:  [phone]   Name: Izola PriceMichael R Barraclough MRN: [id number] Date of Birth: [birth year]

## 2016-09-10 NOTE — Patient Instructions (Signed)
  Tips to help with Attention    1. Do more complex tasks when you are most alert/awake 2. Brake complex tasks up into smaller steps 3. Limit distractions such as noises, music, TV, conversation, phone etc 4. Take breaks if you get frustrated or fatigued 5. If you have something important to do at night or in the evening, rest during the day  Do some cognitive activities every day - card games, board games, checkers etc

## 2016-09-24 ENCOUNTER — Ambulatory Visit: Payer: BLUE CROSS/BLUE SHIELD | Attending: Neurology | Admitting: Speech Pathology

## 2016-09-24 DIAGNOSIS — R41841 Cognitive communication deficit: Secondary | ICD-10-CM | POA: Diagnosis not present

## 2016-09-24 NOTE — Therapy (Signed)
[facility] [address] [place], [place], [address] Phone: [phone]   Fax:  [phone]  Speech Language Pathology Treatment  Patient Details  Name: Bobby Cain MRN: [id number] Date of Birth: 09/26/1980 Referring Provider: Dr. Shon Millet  Encounter Date: 09/24/2016      End of Session - 09/24/16 1224    Visit Number 4   Number of Visits 17   Date for SLP Re-Evaluation 10/10/16   SLP Start Time 0931   SLP Stop Time  1014   SLP Time Calculation (min) 43 min   Activity Tolerance Patient tolerated treatment well      No past medical history on file.  No past surgical history on file.  There were no vitals filed for this visit.      Subjective Assessment - 09/24/16 0941    Subjective Pt arrived 10 min late for ST. "My dad noticed it takes me longer to answer a question"   Currently in Pain? No/denies               ADULT SLP TREATMENT - 09/24/16 0942      General Information   Behavior/Cognition Alert;Cooperative;Pleasant mood     Treatment Provided   Treatment provided Cognitive-Linquistic     Pain Assessment   Pain Assessment No/denies pain     Cognitive-Linquistic Treatment   Treatment focused on Cognition;Patient/family/caregiver education   Skilled Treatment Pt verbalizing awareness of reduced attention while doing yard work. He also reports difficulty sleeping.  Pt reports he has perscription for adderall but has not filled it. I encouraged him to fill it in light of ongong attention difficulty. 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 Pt required written cues to verbalize compensations for attention. He is to make a list of items he needs prior to leaving the house and keep it on  his phone or post it  by his doors. Pt reporting axiety that he is forgetting things when he leaves the house.      Assessment / Recommendations / Plan   Plan Continue with current plan of care     Progression Toward Goals   Progression toward goals Progressing toward goals          SLP Education - 09/24/16 1221    Education provided Yes   Education Details compensations for attention; talk to MD re: depression/anxiety; parents can come to ST   Person(s) Educated Patient   Methods Explanation;Demonstration;Handout   Comprehension Verbalized understanding;Verbal cues required          SLP Short Term Goals - 09/24/16 1223      SLP SHORT TERM GOAL #1   Title Pt will utilize external/environmental aids to keep tract of keys, wallet, cards, remote with no more othan 3 items llost over 2 weeks   Baseline 09/10/16; 09/24/16   Time 2   Period Weeks   Status On-going     SLP SHORT TERM GOAL #2   Title Pt will verbalize 4 compensations for attention impairments with rare min A   Time 2   Period Weeks   Status On-going     SLP SHORT TERM GOAL #3   Title Pt will attend to details in moderately complex organization, reasoning cognitive linguistic tasks with 80% accuracy and occasional min A   Time  2   Period Weeks   Status On-going     SLP SHORT TERM GOAL #4   Title Pt will altenrate attention between 2 moderately complex cognitive linguistic tasks with 85% on each and occasional min A   Time 2   Period Weeks   Status On-going          SLP Long Term Goals - 09/24/16 1223      SLP LONG TERM GOAL #1   Title Pt will perform cognitive linguistic organization tasks with 95% accuracy and rare min A   Time 6   Period Weeks   Status On-going     SLP LONG TERM GOAL #2   Title Pt will carryover strategies/external aids for organizing tasks, schedule, chores over 3 sessions with rare min A   Time 6   Period Weeks   Status On-going     SLP LONG TERM GOAL #3   Title Pt will  divide attention between 2 simple cognitive linguistic tasks with 90% on each and rare min A   Time 6   Period Weeks   Status On-going          Plan - 09/24/16 1221    Clinical Impression Statement Pt continues to require occasional min to mod A for attention to detail, carryover of compensations for attention and and alternating attention. Continue skilled ST to maximize cognition for safety and possible return to prior work. Encouraged pt to take his prescription for adderall   Speech Therapy Frequency 2x / week   Treatment/Interventions Compensatory strategies;Functional tasks;Patient/family education;Cognitive reorganization;Environmental controls;Internal/external aids;SLP instruction and feedback   Potential to Achieve Goals Good   Potential Considerations Ability to learn/carryover information   Consulted and Agree with Plan of Care Patient      Patient will benefit from skilled therapeutic intervention in order to improve the following deficits and impairments:   Cognitive communication deficit    Problem List There are no active problems to display for this patient.   Jillyn Stacey, Radene Journey MS, CCC-SLP 09/24/2016, 12:24 PM  [facility] [address] [place], [place], [address] Phone: [phone]   Fax:  [phone]   Name: Bobby Cain MRN: [id number] Date of Birth: Nov 19, 1980

## 2016-09-24 NOTE — Patient Instructions (Signed)
  Your awareness of attention and memory difficulties has improved - this is great and showing progress  Your parents are always welcome to attend speech therapy  Consider taking your medications for attention as your brain heals, this may help you  Have your parents attend doctors appointments with you  Write down questions for the doctor in your phone- sleeping concerns, mood concerns/depression/anxiety, highs and lows, attention difficulties  Attention to details, alternating attention from one task to the next, processing speed   Be aware of impulsive behavior - stop and think before jumping in   Keep your self and spaces organized - reduce clutter  Do more complex tasks when you are most alert/awake  Stop when you become fatigued - 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   Do cognitive activities every day -    Cognitive Activities you can do at home:   - Solitaire  - Majong  - Scrabble  - Chess/Checkers  - Crosswords (easy level)  - Juanna Cao  - Card Games  - Board Games  - Connect 4  - Simon  - the [employer]  - dominoes  - backgammon  On your computer, tablet or phone: [email or web address] Immunologist

## 2016-09-26 ENCOUNTER — Ambulatory Visit: Payer: BLUE CROSS/BLUE SHIELD | Admitting: Speech Pathology

## 2016-09-26 DIAGNOSIS — R41841 Cognitive communication deficit: Secondary | ICD-10-CM

## 2016-09-26 NOTE — Therapy (Signed)
[facility] [address] [place], [place], [address] Phone: [phone]   Fax:  [phone]  Speech Language Pathology Treatment  Patient Details  Name: Bobby Cain MRN: [id number] Date of Birth: [birth year] Referring Provider: Dr. Shon Millet  Encounter Date: 09/26/2016      End of Session - 09/26/16 1024    Visit Number 5   Number of Visits 17   Date for SLP Re-Evaluation 10/10/16   SLP Start Time 0932   SLP Stop Time  1016   SLP Time Calculation (min) 44 min   Activity Tolerance Patient tolerated treatment well      No past medical history on file.  No past surgical history on file.  There were no vitals filed for this visit.      Subjective Assessment - 09/26/16 0937    Subjective "I played solitaire and tic tac toe with my nephew"   Currently in Pain? No/denies               ADULT SLP TREATMENT - 09/26/16 0937      General Information   Behavior/Cognition Alert;Cooperative;Pleasant mood     Treatment Provided   Treatment provided Cognitive-Linquistic     Pain Assessment   Pain Assessment No/denies pain     Cognitive-Linquistic Treatment   Treatment focused on Cognition;Patient/family/caregiver education   Skilled Treatment Facilitated alternating attention between complex card sort with 3 piles/3 rules and auditory organization task with occasional min A to attend to all of the cards in his hand - 85% accuracy on sort, 90% on auditory taks;  divided attention facilitated with divergent naming tasks, while listenting for money amount and naming bills and coins with 80% accuracy and usual min repetition of amount. Pt reports following through on putting questions for MD in his phone, doing cognitive activites and reviewed ST handouts  with his parents.     Assessment / Recommendations / Plan   Plan Continue with current plan of care     Progression Toward Goals   Progression toward  goals Progressing toward goals          SLP Education - 09/26/16 1015    Education provided Yes   Education Details compensations for attention   Person(s) Educated Patient   Methods Explanation;Demonstration;Handout   Comprehension Verbalized understanding;Verbal cues required          SLP Short Term Goals - 09/26/16 1023      SLP SHORT TERM GOAL #1   Title Pt will utilize external/environmental aids to keep tract of keys, wallet, cards, remote with no more othan 3 items llost over 2 weeks   Baseline 09/10/16; 09/24/16; 09/26/16   Time 2   Period Weeks   Status Achieved     SLP SHORT TERM GOAL #2   Title Pt will verbalize 4 compensations for attention impairments with rare min A   Time 2   Period Weeks   Status On-going     SLP SHORT TERM GOAL #3   Title Pt will attend to details in moderately complex organization, reasoning cognitive linguistic tasks with 80% accuracy and occasional min A   Time 2   Period Weeks   Status On-going     SLP SHORT TERM GOAL #4   Title Pt will altenrate attention between 2 moderately complex cognitive linguistic tasks with 85% on each and occasional min A   Time 2   Period Weeks   Status Achieved  SLP Long Term Goals - 09/26/16 1024      SLP LONG TERM GOAL #1   Title Pt will perform cognitive linguistic organization tasks with 95% accuracy and rare min A   Time 6   Period Weeks   Status On-going     SLP LONG TERM GOAL #2   Title Pt will carryover strategies/external aids for organizing tasks, schedule, chores over 3 sessions with rare min A   Time 6   Period Weeks   Status On-going     SLP LONG TERM GOAL #3   Title Pt will divide attention between 2 simple cognitive linguistic tasks with 90% on each and rare min A   Time 6   Period Weeks   Status On-going          Plan - 09/26/16 1015    Clinical Impression Statement Pt. has demonstrated improved intellectual and some emergent awareness - improved follow  through on cognitive activities at home and compensations for attention impairment. Encouraged him again to consider taking his medication for attention . Pt required occasional min A for alternating attention and usual min to mod A for divided attention. Continue skilled ST to maximize cognition for possible return to prior position at work   Speech Therapy Frequency 2x / week   Treatment/Interventions Compensatory strategies;Functional tasks;Patient/family education;Cognitive reorganization;Environmental controls;Internal/external aids;SLP instruction and feedback   Potential to Achieve Goals Good   Potential Considerations Ability to learn/carryover information   Consulted and Agree with Plan of Care Patient      Patient will benefit from skilled therapeutic intervention in order to improve the following deficits and impairments:   Cognitive communication deficit    Problem List There are no active problems to display for this patient.   Dillian Feig, Radene Journey MS, CCC-SLP 09/26/2016, 10:25 AM  [facility] [address] [place], [place], [address] Phone: [phone]   Fax:  [phone]   Name: Bobby Cain MRN: [id number] Date of Birth: [birth year]

## 2016-10-01 ENCOUNTER — Encounter: Payer: BLUE CROSS/BLUE SHIELD | Admitting: Speech Pathology

## 2016-10-03 ENCOUNTER — Ambulatory Visit: Payer: BLUE CROSS/BLUE SHIELD | Admitting: Speech Pathology

## 2016-10-03 DIAGNOSIS — R41841 Cognitive communication deficit: Secondary | ICD-10-CM | POA: Diagnosis not present

## 2016-10-03 NOTE — Therapy (Signed)
[facility] [address] [place], [place], [address] Phone: [phone]   Fax:  [phone]  Speech Language Pathology Treatment  Patient Details  Name: Bobby Cain MRN: [id number] Date of Birth: September 09, 1980 Referring Provider: Dr. Shon Millet  Encounter Date: 10/03/2016      End of Session - 10/03/16 1622    Visit Number 6   Number of Visits 17   Date for SLP Re-Evaluation 10/10/16   SLP Start Time 0933   SLP Stop Time  1015   SLP Time Calculation (min) 42 min   Activity Tolerance Patient tolerated treatment well      No past medical history on file.  No past surgical history on file.  There were no vitals filed for this visit.      Subjective Assessment - 10/03/16 0939    Subjective "I didn't bring it, but it went good re: homework"   Currently in Pain? No/denies               ADULT SLP TREATMENT - 10/03/16 0948      General Information   Behavior/Cognition Alert;Cooperative;Pleasant mood     Treatment Provided   Treatment provided Cognitive-Linquistic     Pain Assessment   Pain Assessment No/denies pain     Cognitive-Linquistic Treatment   Treatment focused on Cognition;Patient/family/caregiver education   Skilled Treatment Pt required occasional mod questioning cues to verbalize areas of impairment and compensations for attention. Moderately complex reasoning taksks with mod A occasionally to double check work, ID errors - indicating reduced anticipatory awareness. Simple conversation interjected during reasoning tasks resulted in errors on reasoning/attention to details.  Extended time to correct errors today. Pt required min A to verbalize that distractions of talking affect his performance on tasks. 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, mining) Educated (P)  Patient   Methods (P)  Explanation;Demonstration;Handout   Comprehension (P)  Verbalized understanding;Returned demonstration          SLP Short Term Goals - 10/03/16 1621      SLP SHORT TERM GOAL #1   Title Pt will utilize external/environmental aids to keep tract of keys, wallet, cards, remote with no more othan 3 items llost over 2 weeks   Baseline 09/10/16; 09/24/16; 09/26/16   Time 1   Period Weeks   Status Achieved     SLP SHORT TERM GOAL #2   Title Pt will verbalize 4 compensations for attention impairments with rare min A   Time 1   Period Weeks   Status On-going     SLP SHORT TERM GOAL #3   Title Pt will attend to details in moderately complex organization, reasoning cognitive linguistic tasks with 80% accuracy and occasional min A   Time 1   Period Weeks   Status On-going     SLP SHORT TERM GOAL #4   Title Pt will altenrate attention between 2 moderately complex cognitive linguistic tasks with 85% on each and  occasional min A   Time 2   Period Weeks   Status Achieved          SLP Long Term Goals - 10/03/16 1622      SLP LONG TERM GOAL #1   Title Pt will perform cognitive linguistic organization tasks with 95% accuracy and rare min A   Time 5   Period Weeks   Status On-going     SLP LONG TERM GOAL #2   Title Pt will carryover strategies/external aids for organizing tasks, schedule, chores over 3 sessions with rare min A   Time 5   Period Weeks   Status On-going     SLP LONG TERM GOAL #3   Title Pt will divide attention between 2 simple cognitive linguistic tasks with 90% on each and rare min A   Time 5   Period Weeks   Status On-going          Plan  - 10/03/16 1619    Clinical Impression Statement Pt required mod A for moderately complex reasoning and divided attention when conversation was interjected during cognitive task. Onoing cues to double check his work and to ID errors. Continue skilled ST to maximize cognition for possilbe return to prior position at work.    Speech Therapy Frequency 2x / week   Treatment/Interventions Compensatory strategies;Functional tasks;Patient/family education;Cognitive reorganization;Environmental controls;Internal/external aids;SLP instruction and feedback   Potential to Achieve Goals Good   Potential Considerations Ability to learn/carryover information   Consulted and Agree with Plan of Care Patient      Patient will benefit from skilled therapeutic intervention in order to improve the following deficits and impairments:   Cognitive communication deficit    Problem List There are no active problems to display for this patient.   [place] Frieden, Radene Journey MS, CCC-SLP 10/03/2016, 4:23 PM  [facility] [address] [place], [place], [address] Phone: [phone]   Fax:  [phone]   Name: Bobby Cain MRN: [id number] Date of Birth: 01/17/1981

## 2016-10-03 NOTE — Patient Instructions (Signed)
  Bring in all homework/handouts  Be aware that conversation is distracting to you - when working with tools, reduce conversation for safety  Add note to your door/car to remind you to bring in ST sheets  HW: Do circled problems - do not cheat - these are exercises for your brain

## 2016-10-08 ENCOUNTER — Ambulatory Visit: Payer: BLUE CROSS/BLUE SHIELD | Admitting: Speech Pathology

## 2016-10-08 DIAGNOSIS — R41841 Cognitive communication deficit: Secondary | ICD-10-CM | POA: Diagnosis not present

## 2016-10-08 NOTE — Patient Instructions (Signed)
  Waze app - set before you leave the house  Keep a set schedule of wake of sleep  Organize paperwork, house  Do harder chores when you are awake and more fresh  When you are feeling grouchy/angry/frustrated - Stop and Think! What is going on with your body - if you are tired stop and rest  Maylene Roes brain health - anti-inflammation, diet, sleep

## 2016-10-08 NOTE — Therapy (Signed)
[facility] [address] [place], [place], [address] Phone: [phone]   Fax:  [phone]  Speech Language Pathology Treatment  Patient Details  Name: Bobby Cain MRN: [id number] Date of Birth: [birth year] Referring Provider: Dr. Metta Clines  Encounter Date: 10/08/2016      End of Session - 10/08/16 1501    Visit Number 7   Number of Visits 17   Date for SLP Re-Evaluation 10/10/16   SLP Start Time 0941   SLP Stop Time  1016   SLP Time Calculation (min) 35 min   Activity Tolerance Patient tolerated treatment well      No past medical history on file.  No past surgical history on file.  There were no vitals filed for this visit.      Subjective Assessment - 10/08/16 0951    Subjective Pt arrived 10 minutes late to ST   Currently in Pain? No/denies               ADULT SLP TREATMENT - 10/08/16 0952      General Information   Behavior/Cognition Alert;Cooperative;Pleasant mood     Treatment Provided   Treatment provided Cognitive-Linquistic     Pain Assessment   Pain Assessment No/denies pain     Cognitive-Linquistic Treatment   Treatment focused on Cognition;Patient/family/caregiver education   Skilled Treatment Complex reasoning with mod A for organization and usual min A for reasoning. Pt reports he got distracted by a fallen tree on the way here and missed his exit/turn, therefore late for therapy. Attention to details with occasional min A in mod complex reasoning/functional math task, re-reading problems for details with rare min A.      Assessment / Recommendations / Plan   Plan Continue with current plan of care     Progression Toward Goals   Progression toward goals Progressing toward goals          SLP Education - 10/08/16 1014    Education provided Yes   Education Details compensations for attention and recall   Person(s) Educated Patient   Methods  Demonstration;Handout   Comprehension Verbalized understanding;Verbal cues required          SLP Short Term Goals - 10/08/16 1500      SLP SHORT TERM GOAL #1   Title Pt will utilize external/environmental aids to keep tract of keys, wallet, cards, remote with no more othan 3 items llost over 2 weeks   Baseline 09/10/16; 09/24/16; 09/26/16   Time 1   Period Weeks   Status Achieved     SLP SHORT TERM GOAL #2   Title Pt will verbalize 4 compensations for attention impairments with rare min A   Time 1   Period Weeks   Status Partially Met     SLP SHORT TERM GOAL #3   Title Pt will attend to details in moderately complex organization, reasoning cognitive linguistic tasks with 80% accuracy and occasional min A   Time 1   Period Weeks   Status Partially Met     SLP SHORT TERM GOAL #4   Title Pt will altenrate attention between 2 moderately complex cognitive linguistic tasks with 85% on each and occasional min A   Time 2   Period Weeks   Status Achieved          SLP Long Term Goals - 10/08/16 1501      SLP LONG TERM GOAL #1   Title Pt will perform cognitive linguistic organization tasks  with 95% accuracy and rare min A   Time 4   Period Weeks   Status On-going     SLP LONG TERM GOAL #2   Title Pt will carryover strategies/external aids for organizing tasks, schedule, chores over 3 sessions with rare min A   Time 4   Period Weeks   Status On-going     SLP LONG TERM GOAL #3   Title Pt will divide attention between 2 simple cognitive linguistic tasks with 90% on each and rare min A   Time 4   Period Weeks   Status On-going          Plan - 10/08/16 1500    Clinical Impression Statement Pt reports taking Adderall for 1 week - feels that it is helping with his attention/focus. Pt verbalizing concern for brain health/recovery due to h/o of playing football 1st grade through college.  Continues to report increased frustration, anger  especially with fatigue. Instructed to  ask his physician about this. Improvements noted on attention to details in structured tasks with  occasional min A, however pt missed his turn to the clinic this morning as he was distracted by a fallen tree, and arrived late. Continue skilled ST to maximize cognition for safety, QOL and independence.    Speech Therapy Frequency 2x / week      Patient will benefit from skilled therapeutic intervention in order to improve the following deficits and impairments:   Cognitive communication deficit    Problem List There are no active problems to display for this patient.   Doshie Maggi, Annye Rusk MS, CCC-SLP 10/08/2016, 3:02 PM  [facility] [address] [place], [place], [address] Phone: [phone]   Fax:  [phone]   Name: Bobby Cain MRN: [id number] Date of Birth: 1980-08-20

## 2016-10-09 ENCOUNTER — Ambulatory Visit (INDEPENDENT_AMBULATORY_CARE_PROVIDER_SITE_OTHER): Payer: BLUE CROSS/BLUE SHIELD | Admitting: Neurology

## 2016-10-09 ENCOUNTER — Encounter: Payer: Self-pay | Admitting: Neurology

## 2016-10-09 VITALS — BP 136/88 | HR 104 | Temp 98.3°F | Resp 18 | Ht 71.5 in | Wt 224.3 lb

## 2016-10-09 DIAGNOSIS — F0781 Postconcussional syndrome: Secondary | ICD-10-CM
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
## 2016-10-09 NOTE — Patient Instructions (Addendum)
1.  We will schedule you for neurocognitive testing with Dr. Alinda Dooms 2.  Follow up with me afterwards  To help reduce HEADACHES: Coenzyme Q10  ONCE DAILY Riboflavin/Vitamin B2  ONCE DAILY Magnesium oxide  ONCE - TWICE DAILY May stop after headaches are resolved.    To help with INSOMNIA: Melatonin 3-5mg  AT BEDTIME   Other medicines to help decrease inflammation Alpha Lipoic Acid  TWICE DAILY Turmeric  twice daily

## 2016-10-10 ENCOUNTER — Encounter: Payer: BLUE CROSS/BLUE SHIELD | Admitting: Speech Pathology

## 2016-10-15 ENCOUNTER — Ambulatory Visit: Payer: BLUE CROSS/BLUE SHIELD | Admitting: Speech Pathology

## 2016-10-17 ENCOUNTER — Ambulatory Visit: Payer: BLUE CROSS/BLUE SHIELD | Attending: Neurology | Admitting: Speech Pathology

## 2016-10-17 DIAGNOSIS — R41841 Cognitive communication deficit: Secondary | ICD-10-CM | POA: Insufficient documentation

## 2016-10-17 NOTE — Therapy (Signed)
[facility] [address] [place], [place], [address] Phone: [phone]   Fax:  [phone]  Speech Language Pathology Treatment  Patient Details  Name: Bobby Cain MRN: [id number] Date of Birth: Mar 06, 1981 Referring Provider: Dr. Metta Clines  Encounter Date: 10/17/2016      End of Session - 10/17/16 1103    Visit Number 8   Number of Visits 17   Date for SLP Re-Evaluation 10/10/16   SLP Start Time 1017   SLP Stop Time  1058   SLP Time Calculation (min) 41 min   Activity Tolerance Patient tolerated treatment well      No past medical history on file.  No past surgical history on file.  There were no vitals filed for this visit.      Subjective Assessment - 10/17/16 1025    Subjective "I saw the doctor and used my phone to ask questions"               ADULT SLP TREATMENT - 10/17/16 1026      General Information   Behavior/Cognition Alert;Cooperative;Pleasant mood     Treatment Provided   Treatment provided Cognitive-Linquistic     Pain Assessment   Pain Assessment No/denies pain     Cognitive-Linquistic Treatment   Treatment focused on Cognition;Patient/family/caregiver education   Skilled Treatment "I'm having the big test with the psychologist in July." Pt reports using phone to manage his schedule, lists, physician information with success. He is working part tim odd jobs in neighborhood, remembering when to show up and complete jobs withoud difficulty.  Divided attention between midily complex functional calendar task and mildly complex auditiory task. Initially, pt required usual min A to record day, date and month, as he was leaving out one or more of these - After initial cueing, accuracy improved to 95% on calendar task, 90% on auditory classification task with occasional request for repeat.     Assessment / Recommendations / Plan   Plan Continue with current plan of care     Progression Toward Goals   Progression toward goals Progressing toward goals          SLP Education - 10/17/16 1100    Education provided Yes          SLP Short Term Goals - 10/17/16 1102      SLP SHORT TERM GOAL #1   Title Pt will utilize external/environmental aids to keep tract of keys, wallet, cards, remote with no more othan 3 items llost over 2 weeks   Baseline 09/10/16; 09/24/16; 09/26/16   Time 1   Period Weeks   Status Achieved     SLP SHORT TERM GOAL #2   Title Pt will verbalize 4 compensations for attention impairments with rare min A   Time 1   Period Weeks   Status Partially Met     SLP SHORT TERM GOAL #3   Title Pt will attend to details in moderately complex organization, reasoning cognitive linguistic tasks with 80% accuracy and occasional min A   Time 1   Period Weeks   Status Partially Met     SLP SHORT TERM GOAL #4   Title Pt will altenrate attention between 2 moderately complex cognitive linguistic tasks with 85% on each and occasional min A   Time 2   Period Weeks   Status Achieved          SLP Long Term Goals - 10/17/16 1102  SLP LONG TERM GOAL #1   Title Pt will perform cognitive linguistic organization tasks with 95% accuracy and rare min A   Time 3   Period Weeks   Status On-going     SLP LONG TERM GOAL #2   Title Pt will carryover strategies/external aids for organizing tasks, schedule, chores over 3 sessions with rare min A   Time 3   Period Weeks   Status Achieved     SLP LONG TERM GOAL #3   Title Pt will divide attention between 2 simple cognitive linguistic tasks with 90% on each and rare min A   Time 3   Period Weeks   Status On-going          Plan - 10/17/16 1100    Clinical Impression Statement Divided attention today on 2 mildly complex tasks with occasional to rare min A and 90% on each task - pt continues to demonstrate improvement. He is using his phone to manage scheldule, lists, appointments. Continue  skilled ST 1-2 more sessions to maximize cognition for possible/eventual return to work force.   Speech Therapy Frequency 2x / week   Treatment/Interventions Compensatory strategies;Functional tasks;Patient/family education;Cognitive reorganization;Environmental controls;Internal/external aids;SLP instruction and feedback   Potential to Achieve Goals Good   Consulted and Agree with Plan of Care Patient      Patient will benefit from skilled therapeutic intervention in order to improve the following deficits and impairments:   Cognitive communication deficit    Problem List There are no active problems to display for this patient.   Richel Millspaugh, Annye Rusk  MS, CCC-SLP 10/17/2016, 11:03 AM  [facility] [address] [place], [place], [address] Phone: [phone]   Fax:  [phone]   Name: Bobby Cain MRN: [id number] Date of Birth: 25-Dec-1980

## 2016-10-22 ENCOUNTER — Ambulatory Visit: Payer: BLUE CROSS/BLUE SHIELD | Admitting: Speech Pathology

## 2016-10-22 DIAGNOSIS — R41841 Cognitive communication deficit: Secondary | ICD-10-CM | POA: Diagnosis not present

## 2016-10-22 NOTE — Therapy (Signed)
[facility] [address] [place], [place], [address] Phone: [phone]   Fax:  [phone]  Speech Language Pathology Treatment  Patient Details  Name: MEDHANSH BRINKMEIER MRN: [id number] Date of Birth: 05-06-1981 Referring Provider: Dr. Metta Clines  Encounter Date: 10/22/2016      End of Session - 10/22/16 1108    Visit Number 9   Number of Visits 17   Date for SLP Re-Evaluation 10/24/16   SLP Start Time 45   SLP Stop Time  1100   SLP Time Calculation (min) 40 min   Activity Tolerance Patient tolerated treatment well      No past medical history on file.  No past surgical history on file.  There were no vitals filed for this visit.      Subjective Assessment - 10/22/16 1022    Subjective "The word is getting out around the neighborhood that I'm working for the [employer]igb[employer]"   Currently in Pain? 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 Divided attention between attention to detail, correcting errors on menu and answering/commenting to conversation with 2/ 27   errors and rare min A. Organization of mildly complex information with mod I. Pt reports managing his schedule and odd job appointments without error. He is putting his appointments for his odd jobs. Information/questions for neuropsych put in his phone.      Assessment / Recommendations / Plan   Plan Continue with current plan of care     Progression Toward Goals   Progression toward goals Progressing toward goals          SLP  Education - 10/22/16 1101    Education provided Yes   Education Details continue to use phone calendar, notes, and timers to help with schedule management and lists   Person(s) Educated Patient   Methods Explanation;Demonstration   Comprehension Verbalized understanding;Returned demonstration          SLP Short Term Goals - 10/22/16 1104      SLP SHORT TERM GOAL #1   Title Pt will utilize external/environmental aids to keep tract of keys, wallet, cards, remote with no more othan 3 items llost over 2 weeks   Baseline 09/10/16; 09/24/16; 09/26/16   Time 1   Period Weeks   Status Achieved     SLP SHORT TERM GOAL #2   Title Pt will verbalize 4 compensations for attention impairments with rare min A   Time 1   Period Weeks   Status Partially Met     SLP SHORT TERM GOAL #3   Title Pt will attend to details in moderately complex organization, reasoning cognitive linguistic tasks with 80% accuracy and occasional min A   Time 1   Period Weeks   Status Partially Met     SLP SHORT TERM GOAL #4   Title Pt will altenrate attention between 2 moderately complex cognitive linguistic tasks with 85% on each and occasional min A   Time 2   Period Weeks   Status Achieved  SLP Long Term Goals - 10/22/16 1104      SLP LONG TERM GOAL #1   Title Pt will perform cognitive linguistic organization tasks with 95% accuracy and rare min A   Time 3   Period Weeks   Status Achieved     SLP LONG TERM GOAL #2   Title Pt will carryover strategies/external aids for organizing tasks, schedule, chores over 3 sessions with rare min A   Baseline 10/18/16; 10/22/16;    Time 3   Period Weeks   Status Achieved     SLP LONG TERM GOAL #3   Title Pt will divide attention between 2 simple cognitive linguistic tasks with 90% on each and rare min A   Time 3   Period Weeks   Status On-going          Plan - 10/22/16 1102    Clinical Impression Statement Pt making progress with divided attention,  attention to detail, error awareness and improved processing speed. He reports carrying over compensations for recall, and schedule management with his phone. Instructed pt to let neuropsych know of premorbid ADHD and learning differences he had in school. Continue skilled ST 1 more visit to maximize cognition and carryover of compensations for attention and memory impairments.    Speech Therapy Frequency 2x / week   Treatment/Interventions Compensatory strategies;Functional tasks;Patient/family education;Cognitive reorganization;Environmental controls;Internal/external aids;SLP instruction and feedback   Potential to Achieve Goals Good   Potential Considerations Ability to learn/carryover information   Consulted and Agree with Plan of Care Patient      Patient will benefit from skilled therapeutic intervention in order to improve the following deficits and impairments:   Cognitive communication deficit    Problem List There are no active problems to display for this patient.   Sava Proby, Annye Rusk MS, CCC-SLP 10/22/2016, 2:59 PM  [facility] [address] [place], [place], [address] Phone: [phone]   Fax:  [phone]   Name: KHAMANI FAIRLEY MRN: [id number] Date of Birth: [birth year]

## 2016-10-24 ENCOUNTER — Ambulatory Visit: Payer: BLUE CROSS/BLUE SHIELD | Admitting: Speech Pathology

## 2016-10-24 DIAGNOSIS — R41841 Cognitive communication deficit: Secondary | ICD-10-CM

## 2016-10-24 NOTE — Patient Instructions (Addendum)
  Continue to use strategies for attention   Tips to help facilitate better attention, concentration, focus   Do harder, longer tasks when you are most alert/awake  Break down larger tasks into small parts  Limit distractions of TV, radio, conversation, e mails/texts, appliance noise, etc - if a job is important, do it in a quiet room  Be aware of how you are functioning in high stimulation environments such as large stores, parties, restaurants - any place with lots of lights, noise, signs etc  Group conversations may be more difficult to process than one on one conversations  Give yourself extra time to process conversation, reading materials, directions or information from your healthcare providers  Organization is key - clutters of laundry, mail, paperwork, dirty dishes - all make it more difficult to concentrate  Before you start a task, have all the needed supplies, directions, recipes ready and organized. This way you don't have to go looking for something in the middle of a task and become distracted.   Be aware of fatigue - take rests or breaks when needed to re-group and re-focus  Continue to use your phone to keep reminders, lists, appointments, questions for psychologists or physicians  Keep taking your medication for attention  Restate directions or information you are told to make sure you got it all  Processing may be slower as your brain heals

## 2016-10-24 NOTE — Therapy (Signed)
[facility] [address] [place], [place], [address] Phone: [phone]   Fax:  [phone]  Speech Language Pathology Treatment  Patient Details  Name: Bobby Cain MRN: [id number] Date of Birth: Apr 24, 1981 Referring Provider: Dr. Metta Clines  Encounter Date: 10/24/2016      End of Session - 10/24/16 1202    Visit Number 10   Number of Visits 17   Date for SLP Re-Evaluation 10/24/16   SLP Start Time 1016   SLP Stop Time  1057   SLP Time Calculation (min) 41 min   Activity Tolerance Patient tolerated treatment well      No past medical history on file.  No past surgical history on file.  There were no vitals filed for this visit.      Subjective Assessment - 10/24/16 1021    Subjective "I'm sure they say I'm a little bit better" re: parents opinion of pt's progress               ADULT SLP TREATMENT - 10/24/16 1022      General Information   Behavior/Cognition Alert;Cooperative;Pleasant mood     Treatment Provided   Treatment provided Cognitive-Linquistic     Pain Assessment   Pain Assessment No/denies pain     Cognitive-Linquistic Treatment   Treatment focused on Cognition;Patient/family/caregiver education   Skilled Treatment Pt continues to report safety awareness doing yard work and working with tools. Divided attention between math reasoning and abstract reasoning in timely manner with 95% on each task and supervisions cues. Written cues to verbalize compensations for attention impairment.     Assessment / Recommendations / Plan   Plan All goals met     Progression Toward Goals   Progression toward goals Goals met, education completed, patient discharged from [facility]  Visits from Start of Care: 10  Current functional level related to goals / functional outcomes: See goals below   Remaining deficits: Memory, attention   Education /  Equipment: Areas of cognitive impairment, compensations for attention and memory Plan: Patient agrees to discharge.  Patient goals were met. Patient is being discharged due to meeting the stated rehab goals.  ?????           SLP Short Term Goals - 10/24/16 1202      SLP SHORT TERM GOAL #1   Title Pt will utilize external/environmental aids to keep tract of keys, wallet, cards, remote with no more othan 3 items llost over 2 weeks   Baseline 09/10/16; 09/24/16; 09/26/16   Time 1   Period Weeks   Status Achieved     SLP SHORT TERM GOAL #2   Title Pt will verbalize 4 compensations for attention impairments with rare min A   Time 1   Period Weeks   Status Partially Met     SLP SHORT TERM GOAL #3   Title Pt will attend to details in moderately complex organization, reasoning cognitive linguistic tasks with 80% accuracy and occasional min A   Time 1   Period Weeks   Status Partially Met     SLP SHORT TERM GOAL #4   Title Pt will altenrate attention between 2 moderately complex cognitive linguistic tasks with 85% on each and occasional min A   Time 2   Period Weeks   Status Achieved          SLP Long Term Goals - 10/24/16 1202  SLP LONG TERM GOAL #1   Title Pt will perform cognitive linguistic organization tasks with 95% accuracy and rare min A   Time 3   Period Weeks   Status Achieved     SLP LONG TERM GOAL #2   Title Pt will carryover strategies/external aids for organizing tasks, schedule, chores over 3 sessions with rare min A   Baseline 10/18/16; 10/22/16;    Time 3   Period Weeks   Status Achieved     SLP LONG TERM GOAL #3   Title Pt will divide attention between 2 simple cognitive linguistic tasks with 90% on each and rare min A   Time 3   Period Weeks   Status Achieved          Plan - 10/24/16 1201    Clinical Impression Statement Pt has met all goals and education complete - he awaits neurophsych eval. He is to continue compensations for recall and  attention. D/c ST - pt in agreement   Speech Therapy Frequency 2x / week   Treatment/Interventions Compensatory strategies;Functional tasks;Patient/family education;Cognitive reorganization;Environmental controls;Internal/external aids;SLP instruction and feedback   Potential to Achieve Goals Good   Potential Considerations Ability to learn/carryover information   Consulted and Agree with Plan of Care Patient      Patient will benefit from skilled therapeutic intervention in order to improve the following deficits and impairments:   Cognitive communication deficit    Problem List There are no active problems to display for this patient.   Nashon Erbes, Annye Rusk MS, [place] 10/24/2016, 12:03 PM  [facility] [address] [place] [address] [place], [place], [address] Phone: [phone]   Fax:  [phone]   Name: KAMEN HANKEN MRN: [id number] Date of Birth: [birth year]

## 2016-11-02 ENCOUNTER — Encounter (HOSPITAL_COMMUNITY): Payer: Self-pay | Admitting: Family Medicine

## 2016-11-02 ENCOUNTER — Ambulatory Visit (HOSPITAL_COMMUNITY)
Admission: EM | Admit: 2016-11-02 | Discharge: 2016-11-02 | Disposition: A | Discharge status: Home / Self Care | Payer: BLUE CROSS/BLUE SHIELD | Attending: Internal Medicine | Admitting: Internal Medicine

## 2016-11-02 DIAGNOSIS — R21 Rash and other nonspecific skin eruption: Secondary | ICD-10-CM

## 2016-11-02 DIAGNOSIS — L237 Allergic contact dermatitis due to plants, except food: Secondary | ICD-10-CM | POA: Diagnosis not present

## 2016-11-02 MED ORDER — FLUOCINONIDE 0.05 % EX CREA: 1.0000 "application " | TOPICAL_CREAM | Freq: Two times a day (BID) | CUTANEOUS | 1 refills | Status: DC

## 2016-11-02 MED ORDER — METHYLPREDNISOLONE ACETATE 80 MG/ML IJ SUSP
INTRAMUSCULAR | Status: AC
  Filled 2016-11-02: qty 1

## 2016-11-02 MED ORDER — FLUOCINONIDE 0.05 % EX CREA: 1.0000 "application " | TOPICAL_CREAM | Freq: Two times a day (BID) | CUTANEOUS | 1 refills | Status: AC

## 2016-11-02 MED ORDER — METHYLPREDNISOLONE ACETATE 80 MG/ML IJ SUSP
80.0000 mg | Freq: Once | INTRAMUSCULAR | Status: AC
  Administered 2016-11-02: 80 mg via INTRAMUSCULAR

## 2016-11-02 NOTE — ED Provider Notes (Signed)
CSN: [id number]     Arrival date & time 11/02/16  1728 History   First MD Initiated Contact with Patient 11/02/16 1801     Chief Complaint  Patient presents with  . Rash   (Consider location/radiation/quality/duration/timing/severity/associated sxs/prior Treatment) 36 year old male presents to clinic with a chief complaint of a rash bilaterally to both legs. States 4 days ago he was out clearing debris, and he got an exposed to poison oak. States it's intensely itchy, and painful. Red, has had blisters. He has tried Benadryl and hydrocortisone cream without any relief.   The history is provided by the patient.  Rash    History reviewed. No pertinent past medical history. History reviewed. No pertinent surgical history. History reviewed. No pertinent family history. Social History  Substance Use Topics  . Smoking status: Current Some Day Smoker    Packs/day: 0.10    Years: 1.00    Types: Cigarettes  . Smokeless tobacco: Never Used  . Alcohol use Not on file    Review of Systems  Constitutional: Negative.   HENT: Negative.   Respiratory: Negative.   Cardiovascular: Negative.   Musculoskeletal: Negative.   Skin: Positive for rash.  Neurological: Negative.     Allergies  Patient has no known allergies.  Home Medications   Prior to Admission medications   Medication Sig Start Date End Date Taking? Authorizing Provider  amphetamine-dextroamphetamine (ADDERALL) 20 MG tablet Take 3 tablets by mouth daily. 07/22/16   [provider]  diazepam (VALIUM) 10 MG tablet Take 1 tablet by mouth at bedtime. 07/11/16   [provider]  fluocinonide cream (LIDEX) 0.05 % Apply 1 application topically 2 (two) times daily. 11/02/16   Dorena BodoKennard, Eran Windish, NP   Meds Ordered and Administered this Visit   Medications  methylPREDNISolone acetate (DEPO-MEDROL) injection 80 mg (80 mg Intramuscular Given 11/02/16 1820)    BP 132/70   Pulse 86   Temp 98.1 F (36.7 C)   Resp 18    SpO2 100%  No data found.   Physical Exam  Constitutional: He is oriented to person, place, and time. He appears well-developed and well-nourished. No distress.  HENT:  Head: Normocephalic and atraumatic.  Right Ear: External ear normal.  Left Ear: External ear normal.  Eyes: Conjunctivae are normal.  Neck: Normal range of motion.  Cardiovascular: Normal rate and regular rhythm.   Pulmonary/Chest: Effort normal and breath sounds normal.  Neurological: He is alert and oriented to person, place, and time.  Skin: Skin is warm and dry. Capillary refill takes less than 2 seconds. Rash noted. He is not diaphoretic.  Erythemic, blistering, lesions noted bilaterally on both legs from the knee distally to the ankle.  Psychiatric: He has a normal mood and affect. His behavior is normal.  Nursing note and vitals reviewed.   Urgent Care Course     Procedures (including critical care time)  Labs Review Labs Reviewed - No data to display  Imaging Review No results found.     MDM   1. Allergic dermatitis due to poison ivy    Patient given injection of Depo-Medrol in clinic, Lidex topically as a prescription, encouraged over-the-counter Benadryl, return to clinic as needed if symptoms persist    Dorena BodoKennard, Macari Zalesky, NP 11/02/16 1840

## 2016-11-02 NOTE — ED Triage Notes (Signed)
Pt here for poison ivy/oak to arms and legs.

## 2016-11-02 NOTE — Discharge Instructions (Signed)
For your poison ivy, or poison oak, he been given a injection of Depo-Medrol here in clinic, and I prescribed a high potency steroid to apply to your legs. I prescribed Lidex, apply to your legs twice daily. If necessary, I have included a refill of this. I have also attached a coupon to your paperwork if insurance does not cover this. I also recommend over-the-counter Benadryl 2 tablets or 50 mg every 6 hours for the next 2-3 days. If your symptoms persist past one week follow up with your primary care provider or return to clinic

## 2016-11-04 ENCOUNTER — Encounter: Payer: Self-pay | Admitting: Psychology

## 2016-11-04 ENCOUNTER — Ambulatory Visit (INDEPENDENT_AMBULATORY_CARE_PROVIDER_SITE_OTHER): Payer: BLUE CROSS/BLUE SHIELD | Admitting: Psychology

## 2016-11-04 DIAGNOSIS — F329 Major depressive disorder, single episode, unspecified: Secondary | ICD-10-CM

## 2016-11-04 DIAGNOSIS — F909 Attention-deficit hyperactivity disorder, unspecified type: Secondary | ICD-10-CM

## 2016-11-04 DIAGNOSIS — F0781 Postconcussional syndrome: Secondary | ICD-10-CM

## 2016-11-04 DIAGNOSIS — F32A Depression, unspecified: Secondary | ICD-10-CM
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
## 2016-12-25 ENCOUNTER — Ambulatory Visit (INDEPENDENT_AMBULATORY_CARE_PROVIDER_SITE_OTHER): Payer: BLUE CROSS/BLUE SHIELD | Admitting: Psychology

## 2016-12-25 DIAGNOSIS — S060X0S Concussion without loss of consciousness, sequela: Secondary | ICD-10-CM | POA: Diagnosis not present

## 2016-12-25 DIAGNOSIS — F0781 Postconcussional syndrome: Secondary | ICD-10-CM

## 2016-12-25 NOTE — Progress Notes (Signed)
   Neuropsychology Note  Bobby Cain Mccollom returned today for 3 hours of neuropsychological testing with technician, Wallace Kellerana Lorian Yaun, BS, under the supervision of Dr. Elvis CoilMaryBeth Bailar. The patient did not appear overtly distressed by the testing session, per behavioral observation or via self-report to the technician. Rest breaks were offered. Bobby Cain Overbeck will return within 2 weeks for a feedback session with Dr. Alinda DoomsBailar at which time his test performances, clinical impressions and treatment recommendations will be reviewed in detail. The patient understands he can contact our office should he require our assistance before this time.  Full report to follow.

## 2017-01-06 ENCOUNTER — Encounter: Payer: BLUE CROSS/BLUE SHIELD | Admitting: Psychology
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
## 2017-01-14 ENCOUNTER — Ambulatory Visit (INDEPENDENT_AMBULATORY_CARE_PROVIDER_SITE_OTHER): Payer: BLUE CROSS/BLUE SHIELD | Admitting: Psychology

## 2017-01-14 ENCOUNTER — Encounter: Payer: Self-pay | Admitting: Psychology

## 2017-01-14 DIAGNOSIS — S060X0S Concussion without loss of consciousness, sequela: Secondary | ICD-10-CM | POA: Diagnosis not present

## 2017-01-14 NOTE — Patient Instructions (Addendum)
Your testing results were consistent with ADHD. I have given you information on postconcussion syndrome, which you may be experiencing since your concussion in November. PCS will improve over time. I do not think your cognitive function has been altered by that injury, but secondary factors (e.g., physical symptoms, mood) could be contributing to your perceived cognitive decline. Additionally, I think you are experiencing clinical depression at the present time, which can dampen cognitive functioning in daily life.  The combination of longstanding ADHD and current depression are likely the biggest culprits. You can speak with Bobby Cain about whether or not an antidepressant is indicated. I would also recommend working with a psychologist who specializes in ADHD and depression (eg [facility] at [facility]). I don't think you should worry about CTE. Despite what can be seen in the media, CTE is not an established medical diagnosis, and studies investigating CTE remain controversial and limited in clinical application.

## 2017-01-21 ENCOUNTER — Ambulatory Visit: Payer: BLUE CROSS/BLUE SHIELD | Admitting: Neurology

## 2017-02-10 ENCOUNTER — Ambulatory Visit (INDEPENDENT_AMBULATORY_CARE_PROVIDER_SITE_OTHER): Payer: BLUE CROSS/BLUE SHIELD | Admitting: Neurology

## 2017-02-10 ENCOUNTER — Encounter: Payer: Self-pay | Admitting: Neurology

## 2017-02-10 VITALS — BP 130/72 | HR 94 | Ht 72.0 in | Wt 226.3 lb

## 2017-02-10 DIAGNOSIS — F329 Major depressive disorder, single episode, unspecified: Secondary | ICD-10-CM | POA: Diagnosis not present

## 2017-02-10 DIAGNOSIS — F32A Depression, unspecified: Secondary | ICD-10-CM

## 2017-02-10 DIAGNOSIS — F909 Attention-deficit hyperactivity disorder, unspecified type: Secondary | ICD-10-CM

## 2017-02-10 DIAGNOSIS — S060X0S Concussion without loss of consciousness, sequela: Secondary | ICD-10-CM

## 2017-02-10 NOTE — Patient Instructions (Addendum)
I would continue working with Dr. Evelene Croon. In addition, I recommend seeing Dr. Hilma Favors, a psychologist at [facility] who specializes in ADHD and depression (phone number [phone]).
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
## 2017-12-23 ENCOUNTER — Other Ambulatory Visit: Payer: Self-pay | Admitting: Gastroenterology

## 2017-12-23 DIAGNOSIS — R945 Abnormal results of liver function studies: Principal | ICD-10-CM

## 2017-12-23 DIAGNOSIS — R7989 Other specified abnormal findings of blood chemistry: Secondary | ICD-10-CM

## 2018-01-02 ENCOUNTER — Ambulatory Visit
Admission: RE | Admit: 2018-01-02 | Discharge: 2018-01-02 | Disposition: A | Discharge status: Home / Self Care | Payer: BLUE CROSS/BLUE SHIELD | Source: Ambulatory Visit | Attending: Gastroenterology | Admitting: Gastroenterology

## 2018-04-21 ENCOUNTER — Other Ambulatory Visit: Payer: Self-pay | Admitting: Sports Medicine

## 2018-04-21 DIAGNOSIS — M25521 Pain in right elbow: Secondary | ICD-10-CM

## 2018-05-02 ENCOUNTER — Ambulatory Visit
Admission: RE | Admit: 2018-05-02 | Discharge: 2018-05-02 | Disposition: A | Discharge status: Home / Self Care | Payer: BLUE CROSS/BLUE SHIELD | Source: Ambulatory Visit | Attending: Sports Medicine | Admitting: Sports Medicine

## 2018-05-02 DIAGNOSIS — M25521 Pain in right elbow: Secondary | ICD-10-CM

## 2018-08-21 ENCOUNTER — Ambulatory Visit
Admission: RE | Admit: 2018-08-21 | Discharge: 2018-08-21 | Disposition: A | Discharge status: Home / Self Care | Payer: BLUE CROSS/BLUE SHIELD | Source: Ambulatory Visit | Attending: Family Medicine | Admitting: Family Medicine

## 2018-08-21 ENCOUNTER — Other Ambulatory Visit: Payer: Self-pay | Admitting: Family Medicine

## 2018-08-21 DIAGNOSIS — J189 Pneumonia, unspecified organism: Secondary | ICD-10-CM

## 2018-08-24 ENCOUNTER — Other Ambulatory Visit: Payer: Self-pay | Admitting: Family Medicine

## 2018-08-24 DIAGNOSIS — R053 Chronic cough: Secondary | ICD-10-CM

## 2018-08-24 DIAGNOSIS — R05 Cough: Secondary | ICD-10-CM

## 2018-08-28 ENCOUNTER — Other Ambulatory Visit: Payer: Self-pay

## 2018-08-28 ENCOUNTER — Ambulatory Visit
Admission: RE | Admit: 2018-08-28 | Discharge: 2018-08-28 | Disposition: A | Discharge status: Home / Self Care | Payer: BLUE CROSS/BLUE SHIELD | Source: Ambulatory Visit | Attending: Family Medicine | Admitting: Family Medicine

## 2018-08-28 DIAGNOSIS — R053 Chronic cough: Secondary | ICD-10-CM

## 2018-08-28 DIAGNOSIS — R05 Cough: Secondary | ICD-10-CM

## 2018-08-31 IMAGING — US US ABDOMEN LIMITED
1 series · 14 of 25 positions shown · non-contrast
Comparison: None.

CLINICAL DATA: Initial evaluation for elevated LFTs.

EXAM:
ULTRASOUND ABDOMEN LIMITED RIGHT UPPER QUADRANT

[Series 1: us abdomen limited · 0.22mm/px · 14 of 41 slices shown]
[im 1/41]
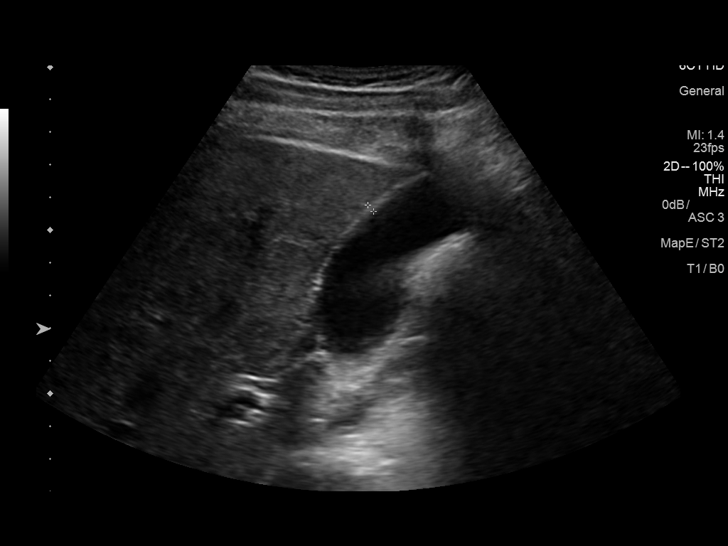
[im 4/41]
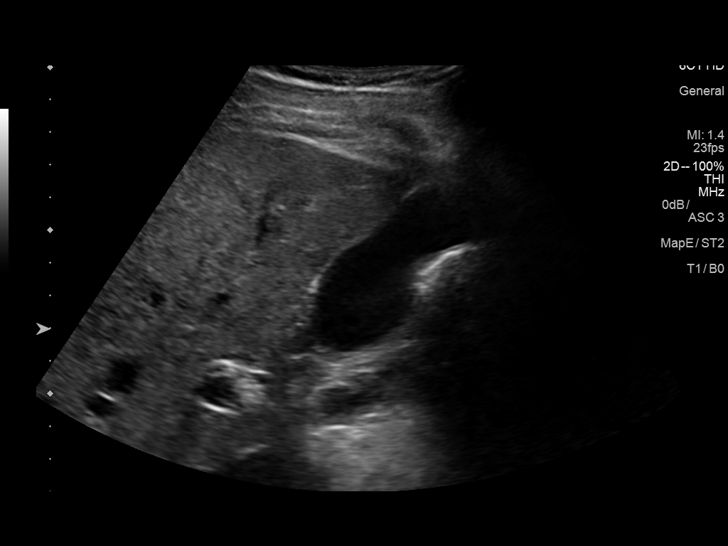
[im 7/41]
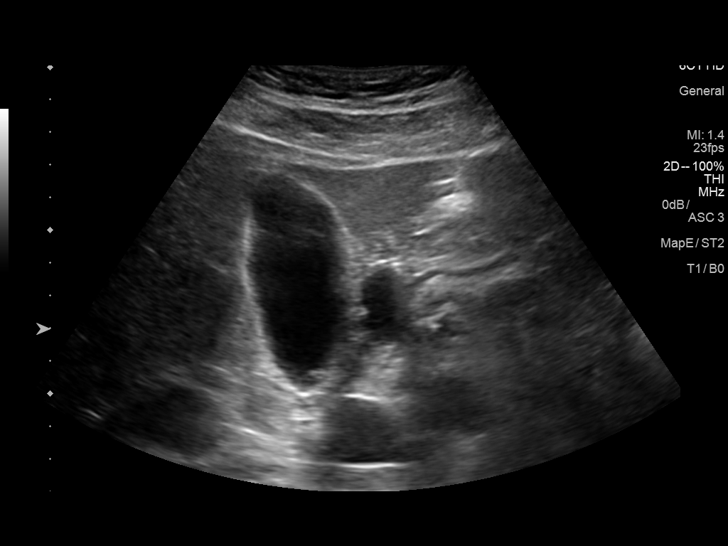
[im 11/41]
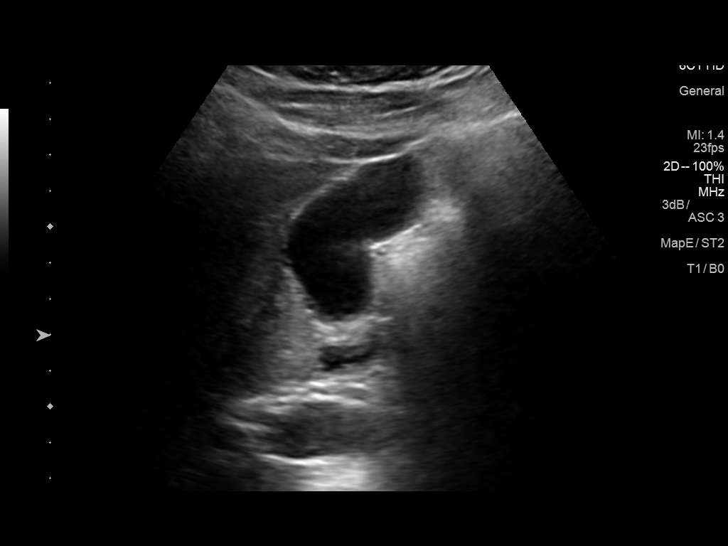
[im 14/41]
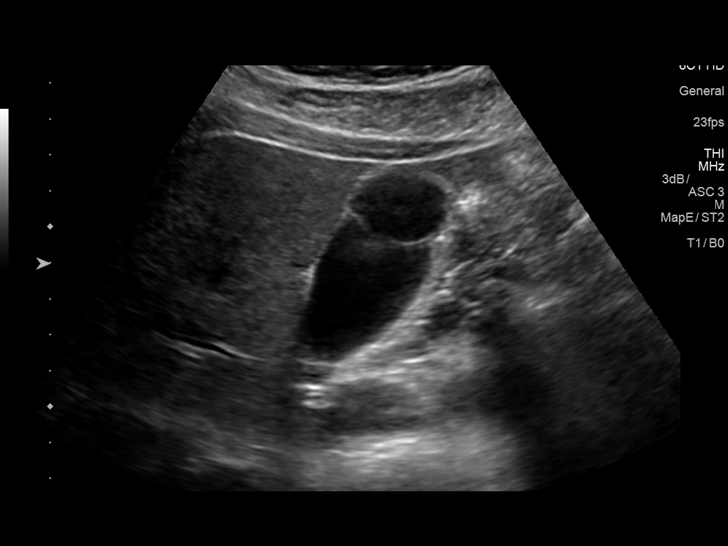
[im 16/41]
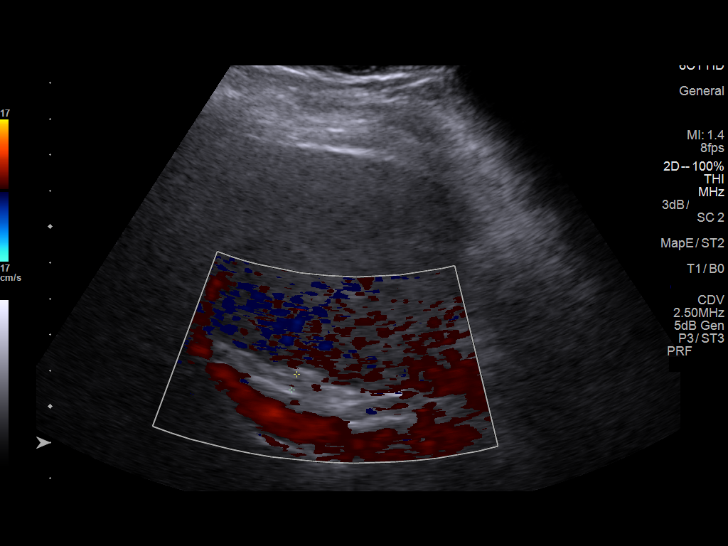
[im 19/41]
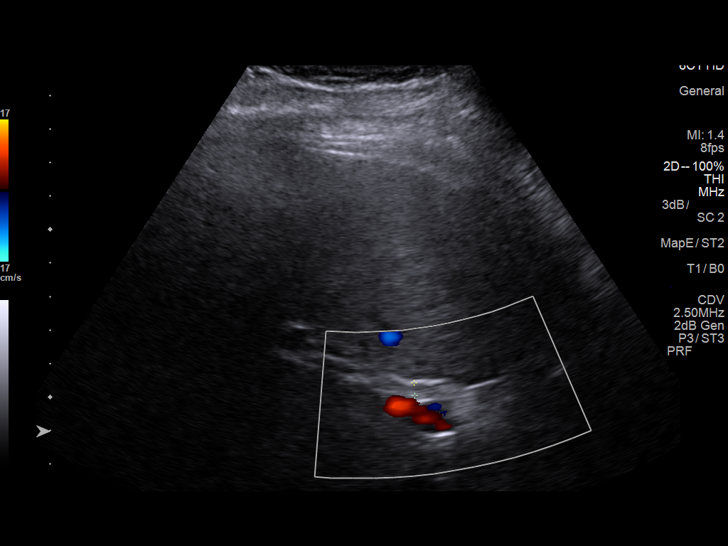
[im 22/41]
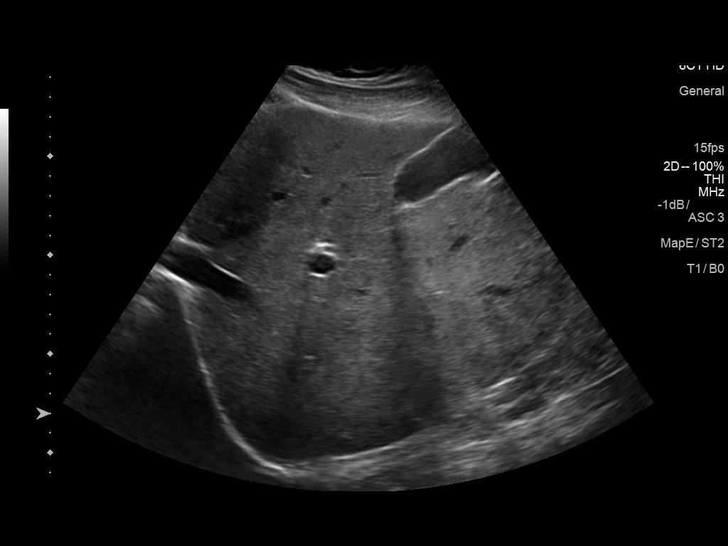
[im 26/41]
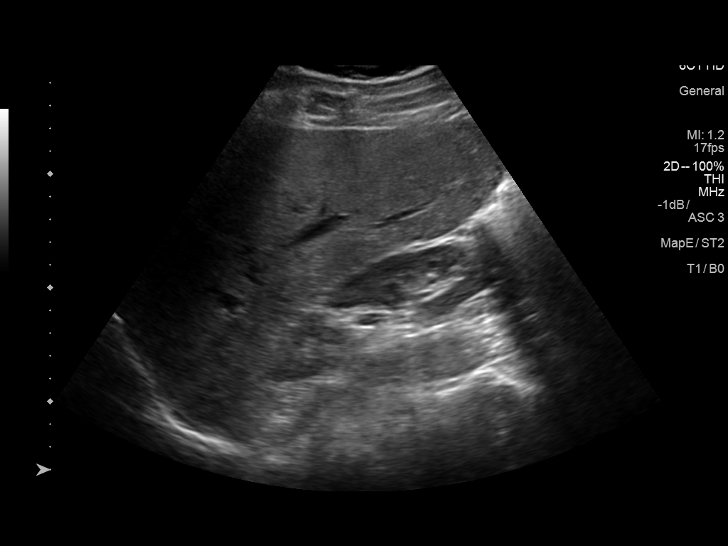
[im 27/41]
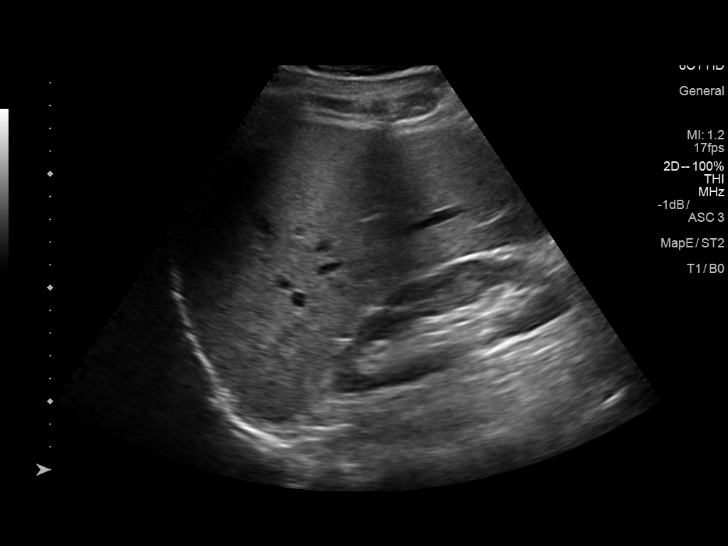
[im 31/41]
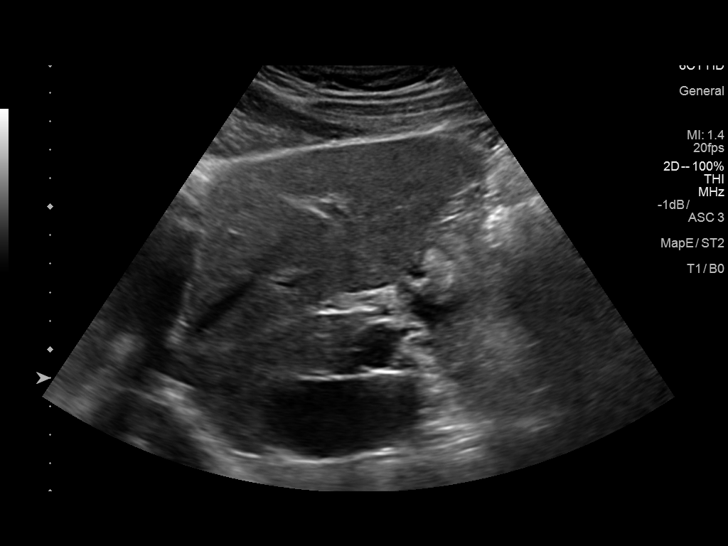
[im 34/41]
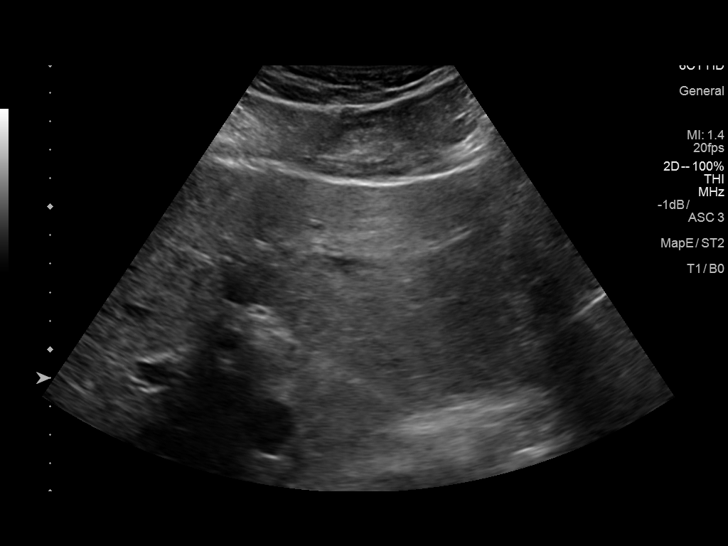
[im 37/41]
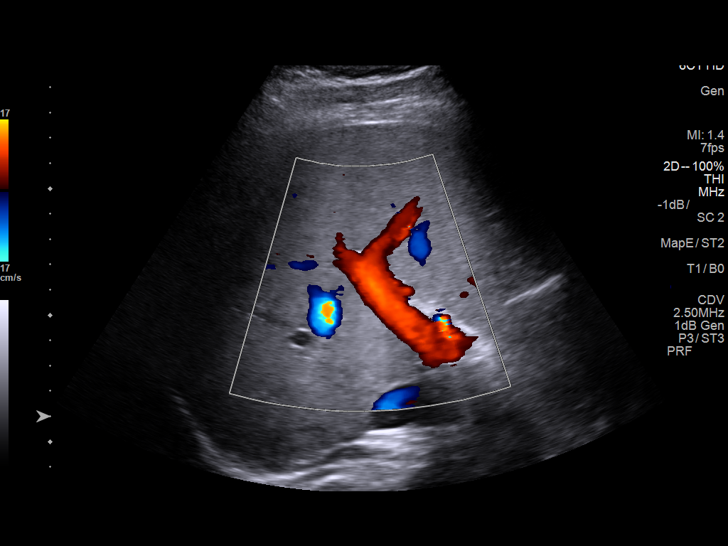
[im 41/41]
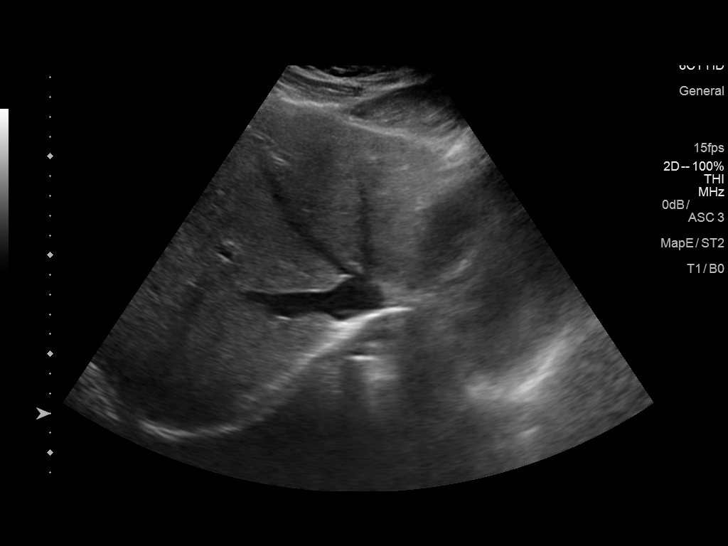

[14 of 25 positions shown; findings below may reference images not displayed]

FINDINGS: Gallbladder:

No gallstones or wall thickening visualized. No sonographic Murphy
sign noted by sonographer.

Common bile duct:

Diameter: 4.6 mm

Liver:

No focal lesion identified. Mildly increased echogenicity within the
hepatic parenchyma, suggesting steatosis. Portal vein is patent on
color Doppler imaging with normal direction of blood flow towards
the liver.
IMPRESSION: 1. Mildly increased echogenicity within the hepatic parenchyma,
suggesting steatosis.
2. Normal sonographic evaluation of the gallbladder.
3. No biliary dilatation.

## 2019-03-28 ENCOUNTER — Emergency Department (HOSPITAL_COMMUNITY): Payer: BC Managed Care – PPO

## 2019-03-28 ENCOUNTER — Other Ambulatory Visit: Payer: Self-pay

## 2019-03-28 ENCOUNTER — Emergency Department (HOSPITAL_COMMUNITY)
Admission: EM | Admit: 2019-03-28 | Discharge: 2019-03-28 | Disposition: A | Discharge status: Home / Self Care | Payer: BC Managed Care – PPO | Attending: Emergency Medicine | Admitting: Emergency Medicine

## 2019-03-28 ENCOUNTER — Encounter (HOSPITAL_COMMUNITY): Payer: Self-pay | Admitting: Emergency Medicine

## 2019-03-28 DIAGNOSIS — M545 Low back pain, unspecified: Secondary | ICD-10-CM

## 2019-03-28 DIAGNOSIS — R109 Unspecified abdominal pain: Secondary | ICD-10-CM | POA: Diagnosis present

## 2019-03-28 DIAGNOSIS — F17290 Nicotine dependence, other tobacco product, uncomplicated: Secondary | ICD-10-CM | POA: Insufficient documentation

## 2019-03-28 DIAGNOSIS — Z79899 Other long term (current) drug therapy: Secondary | ICD-10-CM | POA: Insufficient documentation

## 2019-03-28 LAB — COMPREHENSIVE METABOLIC PANEL
ALT: 51 U/L — ABNORMAL HIGH (ref 0–44)
AST: 54 U/L — ABNORMAL HIGH (ref 15–41)
Albumin: 4 g/dL (ref 3.5–5.0)
Alkaline Phosphatase: 79 U/L (ref 38–126)
Anion gap: 6 (ref 5–15)
BUN: 11 mg/dL (ref 6–20)
CO2: 26 mmol/L (ref 22–32)
Calcium: 8.6 mg/dL — ABNORMAL LOW (ref 8.9–10.3)
Chloride: 106 mmol/L (ref 98–111)
Creatinine, Ser: 0.97 mg/dL (ref 0.61–1.24)
GFR calc Af Amer: >60 mL/min (ref >60)
GFR calc non Af Amer: >60 mL/min (ref >60)
Glucose, Bld: 130 mg/dL — ABNORMAL HIGH (ref 70–99)
Potassium: 4.3 mmol/L (ref 3.5–5.1)
Sodium: 138 mmol/L (ref 135–145)
Total Bilirubin: 0.6 mg/dL (ref 0.3–1.2)
Total Protein: 7 g/dL (ref 6.5–8.1)

## 2019-03-28 LAB — CBC WITH DIFFERENTIAL/PLATELET
Abs Immature Granulocytes: 0.03 10*3/uL (ref 0.00–0.07)
Basophils Absolute: 0.1 10*3/uL (ref 0.0–0.1)
Basophils Relative: 1 %
Eosinophils Absolute: 0.6 10*3/uL — ABNORMAL HIGH (ref 0.0–0.5)
Eosinophils Relative: 7 %
HCT: 45.5 % (ref 39.0–52.0)
Hemoglobin: 15.4 g/dL (ref 13.0–17.0)
Immature Granulocytes: 0 %
Lymphocytes Relative: 27 %
Lymphs Abs: 2.4 10*3/uL (ref 0.7–4.0)
MCH: 28.9 pg (ref 26.0–34.0)
MCHC: 33.8 g/dL (ref 30.0–36.0)
MCV: 85.4 fL (ref 80.0–100.0)
Monocytes Absolute: 0.7 10*3/uL (ref 0.1–1.0)
Monocytes Relative: 8 %
Neutro Abs: 5.2 10*3/uL (ref 1.7–7.7)
Neutrophils Relative %: 57 %
Platelets: 280 10*3/uL (ref 150–400)
RBC: 5.33 MIL/uL (ref 4.22–5.81)
RDW: 12.9 % (ref 11.5–15.5)
WBC: 9 10*3/uL (ref 4.0–10.5)
nRBC: 0 % (ref 0.0–0.2)

## 2019-03-28 LAB — URINALYSIS, ROUTINE W REFLEX MICROSCOPIC
Bilirubin Urine: NEGATIVE
Glucose, UA: NEGATIVE mg/dL
Hgb urine dipstick: NEGATIVE
Ketones, ur: NEGATIVE mg/dL
Leukocytes,Ua: NEGATIVE
Nitrite: NEGATIVE
Protein, ur: NEGATIVE mg/dL
Specific Gravity, Urine: 1.005 (ref 1.005–1.030)
pH: 7 (ref 5.0–8.0)

## 2019-03-28 LAB — D-DIMER, QUANTITATIVE (NOT AT ARMC): D-Dimer, Quant: 0.35 ug/mL-FEU (ref 0.00–0.50)

## 2019-03-28 LAB — LIPASE, BLOOD: Lipase: 36 U/L (ref 11–51)

## 2019-03-28 MED ORDER — KETOROLAC TROMETHAMINE 30 MG/ML IJ SOLN
30.0000 mg | Freq: Once | INTRAMUSCULAR | Status: AC
  Administered 2019-03-28: 30 mg via INTRAMUSCULAR
  Filled 2019-03-28: qty 1

## 2019-03-28 MED ORDER — METHOCARBAMOL 500 MG PO TABS: 500.0000 mg | ORAL_TABLET | Freq: Two times a day (BID) | ORAL | 0 refills | Status: AC

## 2019-03-28 MED ORDER — LIDOCAINE 5 % EX PTCH: 1.0000 | MEDICATED_PATCH | CUTANEOUS | 0 refills | Status: AC

## 2019-03-28 NOTE — ED Provider Notes (Signed)
Bobby Cain is a 38 y.o. male, patient with no pertinent past medical history, presenting to the ED with right lower back pain beginning yesterday.  States his pain arose while urinating.  He describes it as a soreness, constant, worse with palpation, movement, deep breathing, or laying on his back, nonradiating.    HPI from [employer], PA-C: "Pt is a 38 y/o male who presents to the ED today for eval of right flank pain/right lower posterior chest pain. Pain has been ongoing since yesterday. Pain is constant. Denies exacerbating or alleviating factors. Denies associated fevers, chills, NVD, dysuria, frequency, urgency, chest pain, cough or shortness of breath. He does c/o that the pain is worse with inspiration.   Denies leg pain/swelling, hemoptysis, recent surgery/trauma, recent long travel, hormone use, personal hx of cancer, or hx of DVT/PE.   Was seen at [facility] PTA and sent here for eval because he was having RUQ TTP on their evaluation."   History reviewed. No pertinent past medical history.   Physical Exam  BP (!) 144/78 (BP Location: Left Arm)   Pulse 82   Temp 97.7 F (36.5 C) (Oral)   Resp 18   SpO2 98%   Physical Exam Vitals signs and nursing note reviewed.  Constitutional:      General: He is not in acute distress.    Appearance: He is well-developed. He is not diaphoretic.  HENT:     Head: Normocephalic and atraumatic.     Mouth/Throat:     Mouth: Mucous membranes are moist.     Pharynx: Oropharynx is clear.  Eyes:     Conjunctiva/sclera: Conjunctivae normal.  Neck:     Musculoskeletal: Neck supple.  Cardiovascular:     Rate and Rhythm: Normal rate and regular rhythm.     Pulses: Normal pulses.          Radial pulses are 2+ on the right side and 2+ on the left side.       Posterior tibial pulses are 2+ on the right side and 2+ on the left side.     Heart sounds: Normal heart sounds.     Comments: Tactile temperature in the extremities appropriate and  equal bilaterally. Pulmonary:     Effort: Pulmonary effort is normal. No respiratory distress.     Breath sounds: Normal breath sounds.  Abdominal:     Palpations: Abdomen is soft.     Tenderness: There is no abdominal tenderness. There is no guarding.  Musculoskeletal:       Back:     Right lower leg: No edema.     Left lower leg: No edema.  Lymphadenopathy:     Cervical: No cervical adenopathy.  Skin:    General: Skin is warm and dry.  Neurological:     Mental Status: He is alert.     Comments: Sensation grossly intact to light touch in the lower extremities bilaterally. No saddle anesthesias. Strength 5/5 in the bilateral lower extremities. No noted gait deficit. Coordination intact with heel to shin testing.  Psychiatric:        Mood and Affect: Mood and affect normal.        Speech: Speech normal.        Behavior: Behavior normal.     ED Course/Procedures    Procedures   Abnormal Labs Reviewed  URINALYSIS, ROUTINE W REFLEX MICROSCOPIC - Abnormal; Notable for the following components:      Result Value   Color, Urine STRAW (*)  All other components within normal limits  CBC WITH DIFFERENTIAL/PLATELET - Abnormal; Notable for the following components:   Eosinophils Absolute 0.6 (*)    All other components within normal limits  COMPREHENSIVE METABOLIC PANEL - Abnormal; Notable for the following components:   Glucose, Bld 130 (*)    Calcium 8.6 (*)    AST 54 (*)    ALT 51 (*)    All other components within normal limits     ALT  Date Value Ref Range Status  03/28/2019 51 (H) 0 - 44 U/L Final  12/16/2008 121 (H) 0 - 53 U/L Final  12/15/2008 146 (H) 0 - 53 U/L Final    AST  Date Value Ref Range Status  03/28/2019 54 (H) 15 - 41 U/L Final  12/16/2008 103 (H) 0 - 37 U/L Final  12/15/2008 180 (H) 0 - 37 U/L Final    Dg Chest Portable 1 View  Result Date: 03/28/2019 CLINICAL DATA:  Chest pain. EXAM: PORTABLE CHEST 1 VIEW COMPARISON:  August 21, 2018.  FINDINGS: The heart size and mediastinal contours are within normal limits. Both lungs are clear. No pneumothorax or pleural effusion is noted. The visualized skeletal structures are unremarkable. IMPRESSION: No active disease. Electronically Signed   By: Lupita Raider M.D.   On: 03/28/2019 14:46    MDM   Clinical Course as of Mar 28 1731  Wynelle Link Mar 28, 2019  1547 Pain is currently 3/10.  Pain is worse with palpation, when he lays on it,  States, "It feels just like a pulled muscle."   [SJ]  1705 Patient reassessed.  States he "feels great."  Also states, "I have been able to enjoy watching football while I was waiting."   [SJ]    Clinical Course User Index [SJ] Anselm Pancoast, PA-C   Patient care handoff report received from [employer], [place]. Plan: Labs outstanding.  Review labs and reassess patient.  Patient presents with right lower back pain noted yesterday. Atraumatic and no signs of neurovascular compromise.  Patient was quite comfortable during his time under my care. Mild elevation in AST and ALT, but markedly improved from previous values.  Patient states these abnormal values are due to his fatty liver disease. He states apparently urgent care had concern for possible gallbladder etiology.  Patient is nontoxic appearing, afebrile, not tachycardic, not tachypneic, not hypotensive, maintains excellent SPO2 on room air, and is in no apparent distress.  Lab results reassuring.  No leukocytosis.  No abdominal or flank tenderness.  Based on physical exam here in the ED, vitals, and lab findings, suspicion for emergent biliary condition or other source of surgical abdomen is low. D-dimer negative.  Presentation would be quite atypical for ACS, thus my suspicion for this is also low.  Patient has the ability for close PCP follow-up and will do so. The patient was given instructions for home care as well as return precautions. Patient voices understanding of these instructions, accepts  the plan, and is comfortable with discharge.   Findings and plan of care discussed with Cathi Roan, MD. Dr. Jacqulyn Bath personally evaluated and examined this patient.  Vitals:   03/28/19 1149 03/28/19 1622  BP: (!) 144/78 (!) 141/78  Pulse: 82 73  Resp: 18 18  Temp: 97.7 F (36.5 C) 98 F (36.7 C)  TempSrc: Oral   SpO2: 98% 97%      Anselm Pancoast, PA-C 03/28/19 1739    Long, Arlyss Repress, MD 03/29/19 1410

## 2019-03-28 NOTE — Discharge Instructions (Addendum)
°  Take it easy, but do not lay around too much as this may make any stiffness worse.  °Antiinflammatory medications: Take 600 mg of ibuprofen every 6 hours or 440 mg (over the counter dose) to 500 mg (prescription dose) of naproxen every 12 hours for the next 3 days. After this time, these medications may be used as needed for pain. Take these medications with food to avoid upset stomach. Choose only one of these medications, do not take them together. °Acetaminophen (generic for Tylenol): Should you continue to have additional pain while taking the ibuprofen or naproxen, you may add in acetaminophen as needed. Your daily total maximum amount of acetaminophen from all sources should be limited to 4000mg/day for persons without liver problems, or 2000mg/day for those with liver problems. °Methocarbamol: Methocarbamol (generic for Robaxin) is a muscle relaxer and can help relieve stiff muscles or muscle spasms.  Do not drive or perform other dangerous activities while taking this medication as it can cause drowsiness as well as changes in reaction time and judgement. °Lidocaine patches: These are available via either prescription or over-the-counter. The over-the-counter option may be more economical one and are likely just as effective. There are multiple over-the-counter brands, such as Salonpas. °Exercises: Be sure to perform the attached exercises starting with three times a week and working up to performing them daily. This is an essential part of preventing long term problems.  °Follow up: Follow up with a primary care provider for any future management of these complaints. Be sure to follow up within 7-10 days. °Return: Return to the ED should symptoms worsen. ° °For prescription assistance, may try using prescription discount sites or apps, such as goodrx.com °

## 2019-03-28 NOTE — ED Provider Notes (Signed)
[facility] DEPT Provider Note   CSN: [id number] Arrival date & time: 03/28/19  1140     History   Chief Complaint Chief Complaint  Patient presents with  . Flank Pain    HPI Bobby Cain is a 38 y.o. male.     HPI   Pt is a 38 y/o male who presents to the ED today for eval of right flank pain/right lower posterior chest pain. Pain has been ongoing since yesterday. Pain is constant. Denies exacerbating or alleviating factors. Denies associated fevers, chills, NVD, dysuria, frequency, urgency, chest pain, cough or shortness of breath. He does c/o that the pain is worse with inspiration.   Denies leg pain/swelling, hemoptysis, recent surgery/trauma, recent long travel, hormone use, personal hx of cancer, or hx of DVT/PE.   Was seen at [facility] PTA and sent here for eval because he was having RUQ TTP on their evaluation.   History reviewed. No pertinent past medical history.  There are no active problems to display for this patient.   Past Surgical History:  Procedure Laterality Date  . FRACTURE SURGERY    . TONSILLECTOMY          Home Medications    Prior to Admission medications   Medication Sig Start Date End Date Taking? Authorizing Provider  amphetamine-dextroamphetamine (ADDERALL) 20 MG tablet Take 40 mg by mouth daily.  07/22/16  Yes [provider]  diazepam (VALIUM) 10 MG tablet Take 1 tablet by mouth at bedtime. 07/11/16  Yes [provider]  fluocinonide cream (LIDEX) 7.03 % Apply 1 application topically 2 (two) times daily. Patient not taking: Reported on 02/10/2017 11/02/16   Barnet Glasgow, NP    Family History No family history on file.  Social History Social History   Tobacco Use  . Smoking status: Former Smoker    Types: E-cigarettes    Quit date: 10/2016    Years since quitting: 2.4  . Smokeless tobacco: Never Used  Substance Use Topics  . Alcohol use: No  . Drug use: No     Allergies    Patient has no known allergies.   Review of Systems Review of Systems  Constitutional: Negative for fever.  HENT: Negative for ear pain and sore throat.   Eyes: Negative for visual disturbance.  Respiratory: Negative for cough and shortness of breath.   Cardiovascular: Negative for chest pain.  Gastrointestinal: Negative for abdominal pain, constipation, diarrhea, nausea and vomiting.  Genitourinary: Positive for flank pain. Negative for dysuria, hematuria and urgency.  Musculoskeletal: Negative for back pain.  Skin: Negative for rash.  Neurological: Negative for headaches.  All other systems reviewed and are negative.    Physical Exam Updated Vital Signs BP (!) 144/78 (BP Location: Left Arm)   Pulse 82   Temp 97.7 F (36.5 C) (Oral)   Resp 18   SpO2 98%   Physical Exam Vitals signs and nursing note reviewed.  Constitutional:      Appearance: He is well-developed.  HENT:     Head: Normocephalic and atraumatic.  Eyes:     Conjunctiva/sclera: Conjunctivae normal.  Neck:     Musculoskeletal: Neck supple.  Cardiovascular:     Rate and Rhythm: Normal rate and regular rhythm.     Pulses: Normal pulses.     Heart sounds: Normal heart sounds. No murmur.  Pulmonary:     Effort: Pulmonary effort is normal. No respiratory distress.     Breath sounds: Normal breath sounds.  Abdominal:  General: Bowel sounds are normal.     Palpations: Abdomen is soft.     Tenderness: There is no abdominal tenderness. There is no guarding or rebound.     Comments: Mild right cva ttp  Musculoskeletal:     Right lower leg: No edema.     Left lower leg: No edema.  Skin:    General: Skin is warm and dry.  Neurological:     Mental Status: He is alert.      ED Treatments / Results  Labs (all labs ordered are listed, but only abnormal results are displayed) Labs Reviewed  CBC WITH DIFFERENTIAL/PLATELET - Abnormal; Notable for the following components:      Result Value    Eosinophils Absolute 0.6 (*)    All other components within normal limits  URINE CULTURE  URINALYSIS, ROUTINE W REFLEX MICROSCOPIC  COMPREHENSIVE METABOLIC PANEL  LIPASE, BLOOD  D-DIMER, QUANTITATIVE (NOT AT [facility])    EKG None  Radiology Dg Chest Portable 1 View  Result Date: 03/28/2019 CLINICAL DATA:  Chest pain. EXAM: PORTABLE CHEST 1 VIEW COMPARISON:  August 21, 2018. FINDINGS: The heart size and mediastinal contours are within normal limits. Both lungs are clear. No pneumothorax or pleural effusion is noted. The visualized skeletal structures are unremarkable. IMPRESSION: No active disease. Electronically Signed   By: Lupita Raider M.D.   On: 03/28/2019 14:46    Procedures Procedures (including critical care time)  Medications Ordered in ED Medications  ketorolac (TORADOL) 30 MG/ML injection 30 mg (30 mg Intramuscular Given 03/28/19 1442)     Initial Impression / Assessment and Plan / ED Course  I have reviewed the triage vital signs and the nursing notes.  Pertinent labs & imaging results that were available during my care of the patient were reviewed by me and considered in my medical decision making (see chart for details).    Final Clinical Impressions(s) / ED Diagnoses   Final diagnoses:  Right flank pain   Pt is a 38 y/o male who presents to the ED today for eval of right flank pain. Pain has been ongoing since yesterday. Pain is constant. Denies exacerbating or alleviating factors. Denies associated fevers, chills, NVD, dysuria, frequency, urgency, chest pain, cough or shortness of breath. He does c/o that the pain is worse with inspiration.   VSS reassuring. lungs ctab. Heart with RRR. Has right CVA ttp on exam. No abd ttp.   At shift change, labs pending. Care transitioned to [facility], PA-C If ddimer positive, would recommend ct chest/abd/pelvis. If negative, would consider abd imaging.   ED Discharge Orders    None       Rayne Du  03/28/19 1531    Lorre Nick, MD 04/01/19 1125

## 2019-03-28 NOTE — ED Triage Notes (Signed)
Pt c/o right flank pain since yesterday. Was told to come to ED for further eval from clinic today.

## 2019-03-29 LAB — URINE CULTURE: Culture: NO GROWTH

## 2020-04-18 IMAGING — DX DG CHEST 1V PORT
1 series · 1 of 1 positions shown · non-contrast
Comparison: August 21, 2018.

CLINICAL DATA: Chest pain.

EXAM:
PORTABLE CHEST 1 VIEW

[chest ap]
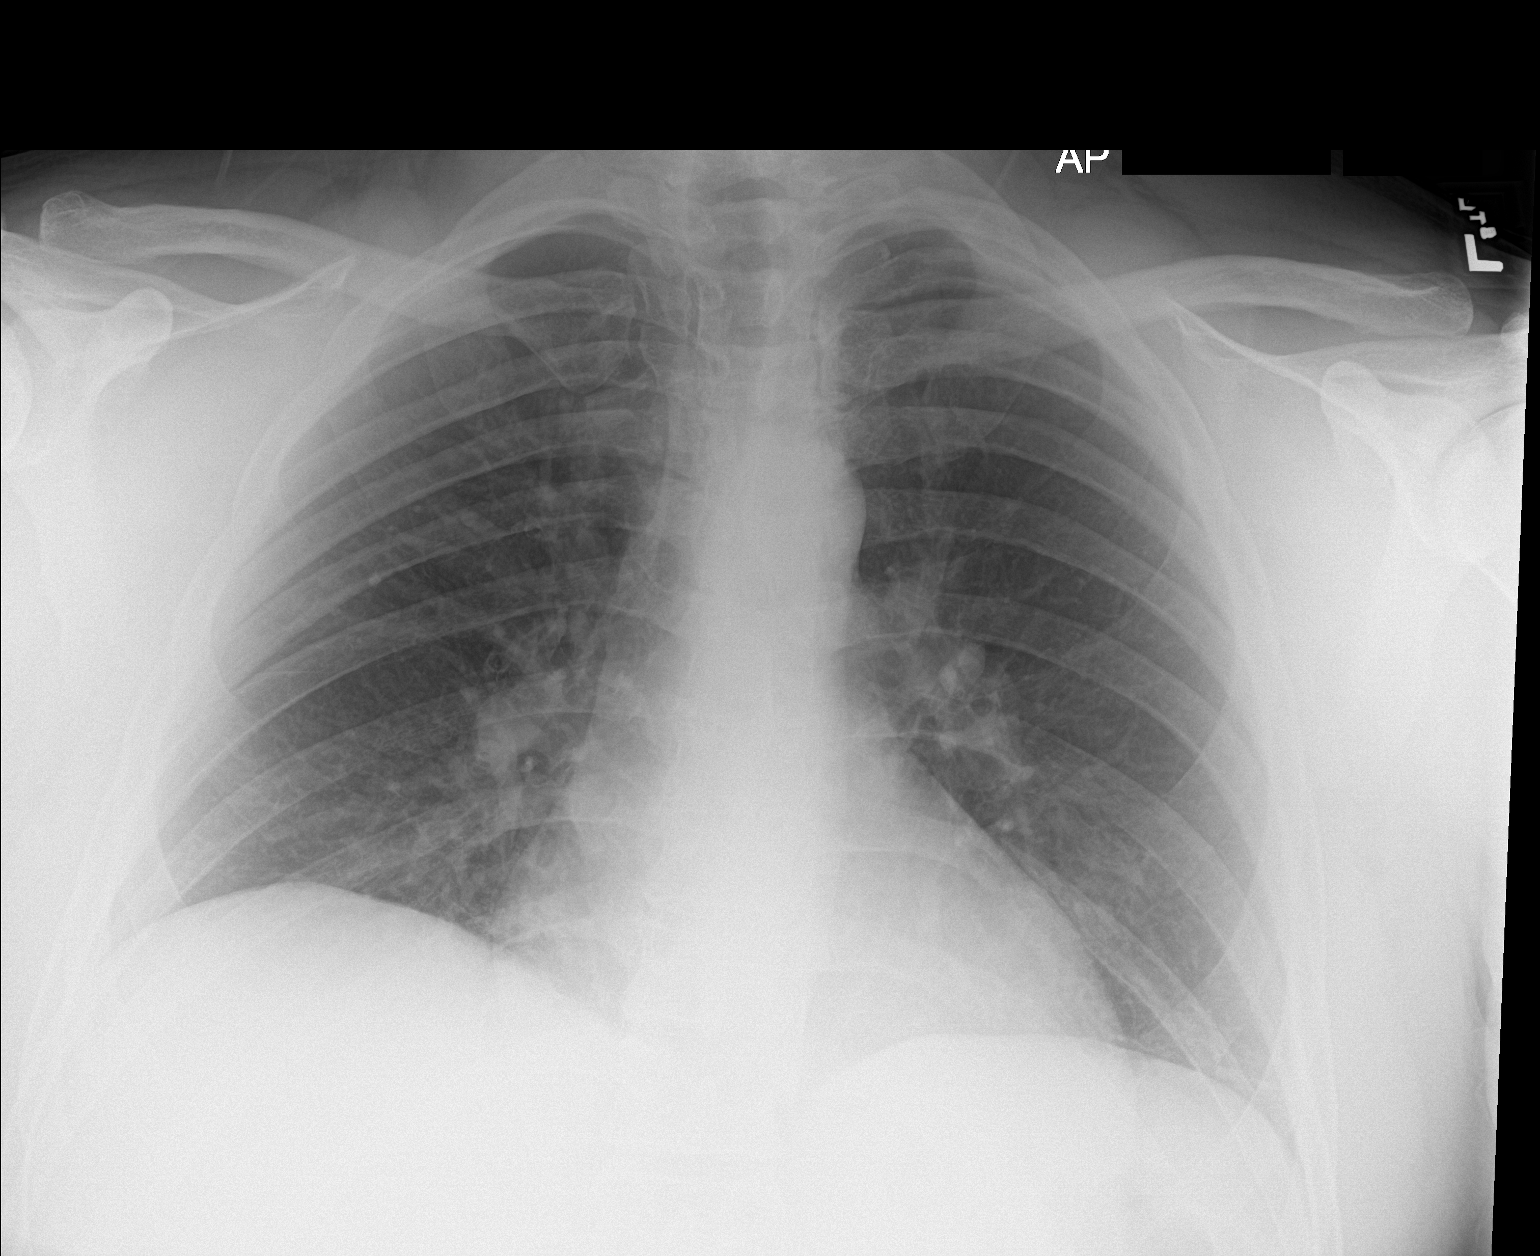

[1 of 1 positions shown; findings below may reference images not displayed]

FINDINGS: The heart size and mediastinal contours are within normal limits.
Both lungs are clear. No pneumothorax or pleural effusion is noted.
The visualized skeletal structures are unremarkable.
IMPRESSION: No active disease.
